# Patient Record
Sex: Male | Born: 1935 | Race: White | Hispanic: No | Marital: Married | Smoking: Never smoker
Health system: Southern US, Community
[De-identification: ages and names within clinical notes are randomized; demographics above are authoritative.]

## PROBLEM LIST (undated history)

## (undated) DIAGNOSIS — Z9079 Acquired absence of other genital organ(s): Secondary | ICD-10-CM

## (undated) DIAGNOSIS — N189 Chronic kidney disease, unspecified: Secondary | ICD-10-CM

## (undated) DIAGNOSIS — B029 Zoster without complications: Secondary | ICD-10-CM

## (undated) DIAGNOSIS — N400 Enlarged prostate without lower urinary tract symptoms: Secondary | ICD-10-CM

## (undated) DIAGNOSIS — I4891 Unspecified atrial fibrillation: Secondary | ICD-10-CM

## (undated) DIAGNOSIS — K219 Gastro-esophageal reflux disease without esophagitis: Secondary | ICD-10-CM

## (undated) DIAGNOSIS — E785 Hyperlipidemia, unspecified: Secondary | ICD-10-CM

## (undated) DIAGNOSIS — R413 Other amnesia: Secondary | ICD-10-CM

## (undated) DIAGNOSIS — K5790 Diverticulosis of intestine, part unspecified, without perforation or abscess without bleeding: Secondary | ICD-10-CM

## (undated) HISTORY — DX: Chronic kidney disease, unspecified: N18.9

## (undated) HISTORY — DX: Zoster without complications: B02.9

## (undated) HISTORY — PX: ORCHIECTOMY: SHX2116

## (undated) HISTORY — PX: HERNIA REPAIR: SHX51

## (undated) HISTORY — DX: Diverticulosis of intestine, part unspecified, without perforation or abscess without bleeding: K57.90

## (undated) HISTORY — DX: Gastro-esophageal reflux disease without esophagitis: K21.9

## (undated) HISTORY — DX: Unspecified atrial fibrillation: I48.91

## (undated) HISTORY — DX: Benign prostatic hyperplasia without lower urinary tract symptoms: N40.0

## (undated) HISTORY — DX: Other amnesia: R41.3

## (undated) HISTORY — DX: Hyperlipidemia, unspecified: E78.5

## (undated) HISTORY — DX: Acquired absence of other genital organ(s): Z90.79

---

## 2004-05-11 ENCOUNTER — Encounter: Admission: RE | Admit: 2004-05-11 | Discharge: 2004-05-11 | Payer: Self-pay | Admitting: Family Medicine

## 2004-07-05 ENCOUNTER — Encounter: Admission: RE | Admit: 2004-07-05 | Discharge: 2004-07-05 | Payer: Self-pay | Admitting: Family Medicine

## 2005-02-25 ENCOUNTER — Encounter: Admission: RE | Admit: 2005-02-25 | Discharge: 2005-02-25 | Payer: Self-pay | Admitting: Family Medicine

## 2005-03-12 ENCOUNTER — Emergency Department (HOSPITAL_COMMUNITY): Admission: EM | Admit: 2005-03-12 | Discharge: 2005-03-12 | Payer: Self-pay | Admitting: Emergency Medicine

## 2006-06-28 ENCOUNTER — Encounter: Admission: RE | Admit: 2006-06-28 | Discharge: 2006-06-28 | Payer: Self-pay | Admitting: Family Medicine

## 2008-06-09 ENCOUNTER — Encounter: Admission: RE | Admit: 2008-06-09 | Discharge: 2008-06-09 | Payer: Self-pay | Admitting: Family Medicine

## 2009-07-24 ENCOUNTER — Ambulatory Visit (HOSPITAL_BASED_OUTPATIENT_CLINIC_OR_DEPARTMENT_OTHER): Admission: RE | Admit: 2009-07-24 | Discharge: 2009-07-24 | Payer: Self-pay | Admitting: Urology

## 2009-09-25 ENCOUNTER — Ambulatory Visit (HOSPITAL_COMMUNITY): Admission: RE | Admit: 2009-09-25 | Discharge: 2009-09-25 | Payer: Self-pay | Admitting: Interventional Cardiology

## 2010-05-15 LAB — CBC
MCV: 90.8 fL (ref 78.0–100.0)
Platelets: 166 10*3/uL (ref 150–400)
RBC: 4.44 MIL/uL (ref 4.22–5.81)
WBC: 5 10*3/uL (ref 4.0–10.5)

## 2010-05-15 LAB — BASIC METABOLIC PANEL
Calcium: 8.5 mg/dL (ref 8.4–10.5)
Chloride: 109 mEq/L (ref 96–112)
Creatinine, Ser: 1.61 mg/dL — ABNORMAL HIGH (ref 0.4–1.5)
GFR calc Af Amer: 51 mL/min — ABNORMAL LOW (ref >60)

## 2010-05-17 LAB — POCT I-STAT 4, (NA,K, GLUC, HGB,HCT)
Glucose, Bld: 101 mg/dL — ABNORMAL HIGH (ref 70–99)
HCT: 41 % (ref 39.0–52.0)
Potassium: 4.5 mEq/L (ref 3.5–5.1)
Sodium: 137 mEq/L (ref 135–145)

## 2010-11-22 ENCOUNTER — Emergency Department (HOSPITAL_COMMUNITY)
Admission: EM | Admit: 2010-11-22 | Discharge: 2010-11-22 | Disposition: A | Discharge status: Home / Self Care | Payer: Medicare Other | Attending: Emergency Medicine | Admitting: Emergency Medicine

## 2010-11-22 ENCOUNTER — Emergency Department (HOSPITAL_COMMUNITY): Payer: Medicare Other

## 2010-11-22 DIAGNOSIS — R002 Palpitations: Secondary | ICD-10-CM | POA: Insufficient documentation

## 2010-11-22 DIAGNOSIS — Z7901 Long term (current) use of anticoagulants: Secondary | ICD-10-CM | POA: Insufficient documentation

## 2010-11-22 DIAGNOSIS — I2510 Atherosclerotic heart disease of native coronary artery without angina pectoris: Secondary | ICD-10-CM | POA: Insufficient documentation

## 2010-11-22 DIAGNOSIS — I4891 Unspecified atrial fibrillation: Secondary | ICD-10-CM | POA: Insufficient documentation

## 2010-11-22 DIAGNOSIS — Z79899 Other long term (current) drug therapy: Secondary | ICD-10-CM | POA: Insufficient documentation

## 2010-11-22 DIAGNOSIS — I10 Essential (primary) hypertension: Secondary | ICD-10-CM | POA: Insufficient documentation

## 2010-11-22 LAB — DIFFERENTIAL
Basophils Absolute: 0 10*3/uL (ref 0.0–0.1)
Basophils Relative: 0 % (ref 0–1)
Eosinophils Absolute: 0.1 10*3/uL (ref 0.0–0.7)
Eosinophils Relative: 1 % (ref 0–5)
Monocytes Absolute: 0.7 10*3/uL (ref 0.1–1.0)

## 2010-11-22 LAB — COMPREHENSIVE METABOLIC PANEL
AST: 17 U/L (ref 0–37)
Albumin: 4.1 g/dL (ref 3.5–5.2)
BUN: 26 mg/dL — ABNORMAL HIGH (ref 6–23)
CO2: 24 mEq/L (ref 19–32)
Calcium: 9.2 mg/dL (ref 8.4–10.5)
Creatinine, Ser: 1.76 mg/dL — ABNORMAL HIGH (ref 0.50–1.35)
GFR calc non Af Amer: 38 mL/min — ABNORMAL LOW (ref >60)

## 2010-11-22 LAB — CBC
MCHC: 33.9 g/dL (ref 30.0–36.0)
RDW: 14 % (ref 11.5–15.5)

## 2010-11-22 LAB — PROTIME-INR
INR: 2.18 — ABNORMAL HIGH (ref 0.00–1.49)
Prothrombin Time: 24.6 seconds — ABNORMAL HIGH (ref 11.6–15.2)

## 2010-11-22 LAB — CK TOTAL AND CKMB (NOT AT ARMC)
CK, MB: 3.1 ng/mL (ref 0.3–4.0)
Relative Index: 2.6 — ABNORMAL HIGH (ref 0.0–2.5)
Total CK: 119 U/L (ref 7–232)

## 2012-03-09 ENCOUNTER — Other Ambulatory Visit: Payer: Self-pay | Admitting: Neurology

## 2012-03-09 DIAGNOSIS — R413 Other amnesia: Secondary | ICD-10-CM

## 2012-03-15 ENCOUNTER — Ambulatory Visit
Admission: RE | Admit: 2012-03-15 | Discharge: 2012-03-15 | Disposition: A | Discharge status: Home / Self Care | Payer: No Typology Code available for payment source | Source: Ambulatory Visit | Attending: Neurology | Admitting: Neurology

## 2012-03-15 DIAGNOSIS — R413 Other amnesia: Secondary | ICD-10-CM

## 2012-03-24 ENCOUNTER — Other Ambulatory Visit (HOSPITAL_COMMUNITY): Payer: Self-pay | Admitting: Neurology

## 2012-03-24 DIAGNOSIS — R413 Other amnesia: Secondary | ICD-10-CM

## 2012-04-12 ENCOUNTER — Encounter (HOSPITAL_COMMUNITY): Payer: No Typology Code available for payment source

## 2012-04-19 ENCOUNTER — Ambulatory Visit (HOSPITAL_COMMUNITY)
Admission: RE | Admit: 2012-04-19 | Discharge: 2012-04-19 | Disposition: A | Discharge status: Home / Self Care | Payer: No Typology Code available for payment source | Source: Ambulatory Visit | Attending: Neurology | Admitting: Neurology

## 2012-04-19 ENCOUNTER — Encounter (HOSPITAL_COMMUNITY): Payer: Self-pay

## 2012-04-19 DIAGNOSIS — Z006 Encounter for examination for normal comparison and control in clinical research program: Secondary | ICD-10-CM | POA: Insufficient documentation

## 2012-04-19 DIAGNOSIS — R413 Other amnesia: Secondary | ICD-10-CM | POA: Insufficient documentation

## 2012-05-17 ENCOUNTER — Ambulatory Visit (INDEPENDENT_AMBULATORY_CARE_PROVIDER_SITE_OTHER): Payer: Self-pay | Admitting: *Deleted

## 2012-05-17 NOTE — Research (Signed)
Patient was seen today for the Visit 2 of the EXPEDITION 3 trial. Retest labs were drawn on April 19, 2012 since patient was out of screening window. Dr. Pearlean Brownie performed and neuro and physical exam. Dr. Pearlean Brownie review all of the data collected prior to randomization and determined that patient was still eligible to proceed with the Baseline/Randomization visit. Multiple questionnaires were completed including MMSE, ADAS Cog, ADCS, CDR, NPI, FAQ, RUD, EQ-5D, Quality of Life AD, and CSSRS. Vitals signs were obtain per trial requirements. ECG's were performed in triplicate and reviewed by Dr. Pearlean Brownie. Patient stated that there were no AE's, SAE's or  changes in medication since last office visit. Dr. Pearlean Brownie stated that patient meets the inclusion/exclusion criteria to be randomized. At 13:58, a 22g cath was started in right [facility] X 1 attempt. Visit 2 labs, Biomarkers, and APOE were collect from IV site at 14:00.  Patient was randomized to study drug # [id number]. I mixed the study drug with 50cc of NS at 14:06 and the study drug infusion was started at 14:12. Study infusion was complete at 14:42 and flushed with 20cc of NS. Patient tolerated infusion well. Post infusion Plasma [id number] lab was drawn from left AC at 15:05. Patient was monitored in clinic til 15:50 and released with caregiver in stable condition. Instructions and follow-up calender was given to caregiver prior to release from clinic.

## 2012-06-08 ENCOUNTER — Ambulatory Visit (INDEPENDENT_AMBULATORY_CARE_PROVIDER_SITE_OTHER): Payer: Self-pay | Admitting: *Deleted

## 2012-06-08 DIAGNOSIS — F0280 Dementia in other diseases classified elsewhere without behavioral disturbance: Secondary | ICD-10-CM

## 2012-06-08 DIAGNOSIS — G309 Alzheimer's disease, unspecified: Secondary | ICD-10-CM | POA: Insufficient documentation

## 2012-06-08 NOTE — Progress Notes (Signed)
Patient was seen today for the visit 3 in the EXPEDITION 3 trial. No reported adverse events or SAE's. Labs were drawn prior to infusion. Patient was given study drug 2269 via infusion and released from the research department in stable condition.

## 2012-07-12 ENCOUNTER — Encounter (INDEPENDENT_AMBULATORY_CARE_PROVIDER_SITE_OTHER): Payer: Self-pay

## 2012-07-12 DIAGNOSIS — Z0289 Encounter for other administrative examinations: Secondary | ICD-10-CM

## 2012-07-29 ENCOUNTER — Other Ambulatory Visit: Payer: Self-pay | Admitting: Neurology

## 2012-08-06 ENCOUNTER — Ambulatory Visit (INDEPENDENT_AMBULATORY_CARE_PROVIDER_SITE_OTHER): Payer: Self-pay

## 2012-08-06 DIAGNOSIS — Z0289 Encounter for other administrative examinations: Secondary | ICD-10-CM

## 2012-09-06 ENCOUNTER — Ambulatory Visit (INDEPENDENT_AMBULATORY_CARE_PROVIDER_SITE_OTHER): Payer: Self-pay | Admitting: *Deleted

## 2012-09-06 DIAGNOSIS — Z0289 Encounter for other administrative examinations: Secondary | ICD-10-CM

## 2012-10-04 ENCOUNTER — Ambulatory Visit (INDEPENDENT_AMBULATORY_CARE_PROVIDER_SITE_OTHER): Payer: Self-pay

## 2012-10-04 DIAGNOSIS — Z0289 Encounter for other administrative examinations: Secondary | ICD-10-CM

## 2012-10-05 NOTE — Progress Notes (Signed)
NA

## 2012-10-26 ENCOUNTER — Encounter: Payer: Self-pay | Admitting: Neurology

## 2012-10-26 DIAGNOSIS — I4891 Unspecified atrial fibrillation: Secondary | ICD-10-CM | POA: Insufficient documentation

## 2012-10-26 DIAGNOSIS — N400 Enlarged prostate without lower urinary tract symptoms: Secondary | ICD-10-CM | POA: Insufficient documentation

## 2012-10-26 DIAGNOSIS — N189 Chronic kidney disease, unspecified: Secondary | ICD-10-CM | POA: Insufficient documentation

## 2012-10-26 DIAGNOSIS — E785 Hyperlipidemia, unspecified: Secondary | ICD-10-CM | POA: Insufficient documentation

## 2012-10-26 DIAGNOSIS — K219 Gastro-esophageal reflux disease without esophagitis: Secondary | ICD-10-CM | POA: Insufficient documentation

## 2012-10-26 DIAGNOSIS — Z9079 Acquired absence of other genital organ(s): Secondary | ICD-10-CM | POA: Insufficient documentation

## 2012-10-26 DIAGNOSIS — K5790 Diverticulosis of intestine, part unspecified, without perforation or abscess without bleeding: Secondary | ICD-10-CM | POA: Insufficient documentation

## 2012-10-26 DIAGNOSIS — N529 Male erectile dysfunction, unspecified: Secondary | ICD-10-CM | POA: Insufficient documentation

## 2012-10-26 DIAGNOSIS — B029 Zoster without complications: Secondary | ICD-10-CM | POA: Insufficient documentation

## 2012-11-01 ENCOUNTER — Encounter (INDEPENDENT_AMBULATORY_CARE_PROVIDER_SITE_OTHER): Payer: Self-pay

## 2012-11-01 DIAGNOSIS — Z0289 Encounter for other administrative examinations: Secondary | ICD-10-CM

## 2012-11-22 ENCOUNTER — Encounter (INDEPENDENT_AMBULATORY_CARE_PROVIDER_SITE_OTHER): Payer: Self-pay

## 2012-11-22 DIAGNOSIS — Z0289 Encounter for other administrative examinations: Secondary | ICD-10-CM

## 2012-11-28 ENCOUNTER — Ambulatory Visit (INDEPENDENT_AMBULATORY_CARE_PROVIDER_SITE_OTHER): Payer: Medicare Other | Admitting: Pharmacist

## 2012-11-28 DIAGNOSIS — I4891 Unspecified atrial fibrillation: Secondary | ICD-10-CM

## 2012-11-28 LAB — POCT INR: INR: 2.5

## 2012-12-17 ENCOUNTER — Encounter: Payer: Self-pay | Admitting: Interventional Cardiology

## 2012-12-17 ENCOUNTER — Ambulatory Visit (INDEPENDENT_AMBULATORY_CARE_PROVIDER_SITE_OTHER): Payer: Medicare Other | Admitting: Interventional Cardiology

## 2012-12-17 VITALS — BP 110/70 | HR 65 | Ht 72.0 in | Wt 163.0 lb

## 2012-12-17 DIAGNOSIS — I4891 Unspecified atrial fibrillation: Secondary | ICD-10-CM

## 2012-12-17 DIAGNOSIS — I10 Essential (primary) hypertension: Secondary | ICD-10-CM | POA: Insufficient documentation

## 2012-12-17 DIAGNOSIS — E782 Mixed hyperlipidemia: Secondary | ICD-10-CM | POA: Insufficient documentation

## 2012-12-17 NOTE — Progress Notes (Signed)
Patient ID: Steven Thompson, male   DOB: Jul 25, 1932, 79 y.o.   MRN: [id number]    [address] [place], [place]  [address] Phone: [phone] Fax:  [phone]  Date:  12/17/2012   ID:  Steven Thompson, DOB May 30, 1932, MRN [id number]  PCP:  No primary provider on file.      History of Present Illness: Steven Thompson is a 79 y.o. male who has had difficult to control HTN. He continues to exercise. His BP has beenwell controlled.He has had readings in the 110-120s at times in the past few months. Atrial Fibrillation F/U:  No sx of AFib. No bleeding problems. Denies : Chest pain.  Dizziness.  Leg edema.  Palpitations.  Shortness of breath.  Syncope.  Exercising several times a week.     Wt Readings from Last 3 Encounters:  12/17/12 163 lb (73.936 kg)  03/07/12 171 lb (77.565 kg)     Past Medical History  Diagnosis Date  . Memory loss   . Atrial fibrillation   . Diverticulosis   . Gastroesophageal reflux   . Hyperlipidemia   . Benign prostatic hypertrophy   . Herpes zoster   . Chronic kidney disease   . History of orchiectomy, unilateral     Current Outpatient Prescriptions  Medication Sig Dispense Refill  . amLODipine (NORVASC) 5 MG tablet Take 5 mg by mouth daily.      Marland Kitchen diltiazem (CARDIZEM) 120 MG tablet Take 120 mg by mouth 4 (four) times daily.      . finasteride (PROSCAR) 5 MG tablet Take 5 mg by mouth daily.      . fish oil-omega-3 fatty acids 1000 MG capsule Take 2 g by mouth daily.      . flecainide (TAMBOCOR) 100 MG tablet Take 100 mg by mouth 2 (two) times daily.      . Methylfol-Algae-B12-Acetylcyst (CEREFOLIN NAC) 6-90.314-2-600 MG TABS TAKE 1 TABLET BY MOUTH EVERY DAY  90 tablet  1  . omeprazole (PRILOSEC) 20 MG capsule Take 20 mg by mouth daily.      Marland Kitchen PARoxetine (PAXIL) 20 MG tablet Take 20 mg by mouth every morning.      . rosuvastatin (CRESTOR) 10 MG tablet Take 10 mg by mouth daily.      Marland Kitchen warfarin (COUMADIN) 5 MG tablet Take 5  mg by mouth daily.       No current facility-administered medications for this visit.    Allergies:   No Known Allergies  Social History:  The patient  reports that he has never smoked. He does not have any smokeless tobacco history on file. He reports that he does not drink alcohol or use illicit drugs.   Family History:  The patient's family history includes Heart attack in his father; Stroke in his mother.   ROS:  Please see the history of present illness.  No nausea, vomiting.  No fevers, chills.  No focal weakness.  No dysuria.    All other systems reviewed and negative.   PHYSICAL EXAM: VS:  BP 110/70  Pulse 65  Ht 6' (1.829 m)  Wt 163 lb (73.936 kg)  BMI 22.1 kg/m2 Well nourished, well developed, in no acute distress HEENT: normal Neck: no JVD, no carotid bruits Cardiac:  normal S1, S2; RRR; 2/6 murmur, systolic Lungs:  clear to auscultation bilaterally, no wheezing, rhonchi or rales Abd: soft, nontender, no hepatomegaly Ext: no edema Skin: warm and dry Neuro:   no focal abnormalities noted  EKG:  NSR, prolonged PR, IVCD    ASSESSMENT AND PLAN:  Atrial fibrillation  Continue Flecainide Acetate Tablet, 100 MG, 1 tablet, twice a day No signs of AFib. COumadin for stroke prevention.  Coumadin typically in range. 2. Hypertension, essential  Continue Diltiazem HCl Coated Beads Capsule Extended Release 24 Hour, 120 MG, 1 tablet, once a day Increase Amlodipine Besylate Tablet, 10 MG, 1 tablet, Orally, Once a day for BP, 90 days, 90, Refills 3 BP controlled at home on combination of calcium channel blockers. Target systolic < 140.  Try for 150 minutes /week of exercise. 3. Mixed hyperlipidemia  Continue Crestor Tablet, 10 MG, 1 tablet, Orally, once a day for cholesterol Lipids reviewed and well-controlled.  LDL 97 in 10/14.  HDL 88.     Signed, Fredric Mare, MD, [facility] 12/17/2012 12:27 PM

## 2012-12-17 NOTE — Patient Instructions (Signed)
Your physician wants you to follow-up in: 1 year with Dr. Varanasi. You will receive a reminder letter in the mail two months in advance. If you don't receive a letter, please call our office to schedule the follow-up appointment.  Your physician recommends that you continue on your current medications as directed. Please refer to the Current Medication list given to you today.  

## 2012-12-27 ENCOUNTER — Encounter (INDEPENDENT_AMBULATORY_CARE_PROVIDER_SITE_OTHER): Payer: Self-pay

## 2012-12-27 DIAGNOSIS — Z0289 Encounter for other administrative examinations: Secondary | ICD-10-CM

## 2013-01-09 ENCOUNTER — Ambulatory Visit (INDEPENDENT_AMBULATORY_CARE_PROVIDER_SITE_OTHER): Payer: Medicare Other | Admitting: Pharmacist

## 2013-01-09 ENCOUNTER — Encounter (INDEPENDENT_AMBULATORY_CARE_PROVIDER_SITE_OTHER): Payer: Self-pay

## 2013-01-09 DIAGNOSIS — I4891 Unspecified atrial fibrillation: Secondary | ICD-10-CM

## 2013-01-09 LAB — POCT INR: INR: 2.9

## 2013-01-21 ENCOUNTER — Other Ambulatory Visit: Payer: Self-pay | Admitting: Neurology

## 2013-01-31 ENCOUNTER — Telehealth: Payer: Self-pay | Admitting: *Deleted

## 2013-01-31 ENCOUNTER — Encounter (INDEPENDENT_AMBULATORY_CARE_PROVIDER_SITE_OTHER): Payer: Self-pay

## 2013-01-31 DIAGNOSIS — Z0289 Encounter for other administrative examinations: Secondary | ICD-10-CM

## 2013-01-31 NOTE — Telephone Encounter (Signed)
Yes if he can afford it

## 2013-01-31 NOTE — Telephone Encounter (Signed)
Pt in for research appt with W. Tilda Franco.  Pt asking about continuing the Cerefolin NAC.   Please advise.   Thanks.

## 2013-02-04 ENCOUNTER — Other Ambulatory Visit: Payer: Self-pay | Admitting: Neurology

## 2013-02-04 NOTE — Telephone Encounter (Signed)
Insurance does not cover Cerefolin, as it is considered a dietary supplement.  I called the patient and explained this to him.    I advised him we could send the Rx to [facility], which is a mail order many people use because ins does not cover this med.  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  If this Rx costs too much, they will call back and we will send Rx to [facility].

## 2013-02-04 NOTE — Telephone Encounter (Signed)
I called pt and relayed that Dr. Pearlean Brownie did want him to continue this if could afford.   Pt stated he needed PA for this.  Will send to [facility] in [place].

## 2013-02-05 ENCOUNTER — Other Ambulatory Visit: Payer: Self-pay | Admitting: Interventional Cardiology

## 2013-02-06 ENCOUNTER — Other Ambulatory Visit: Payer: Self-pay | Admitting: Interventional Cardiology

## 2013-02-08 ENCOUNTER — Other Ambulatory Visit: Payer: Self-pay | Admitting: *Deleted

## 2013-02-22 ENCOUNTER — Ambulatory Visit (INDEPENDENT_AMBULATORY_CARE_PROVIDER_SITE_OTHER): Payer: Medicare Other | Admitting: Pharmacist

## 2013-02-22 DIAGNOSIS — I4891 Unspecified atrial fibrillation: Secondary | ICD-10-CM

## 2013-02-22 LAB — POCT INR: INR: 2.1

## 2013-02-25 ENCOUNTER — Encounter (INDEPENDENT_AMBULATORY_CARE_PROVIDER_SITE_OTHER): Payer: Self-pay

## 2013-02-25 DIAGNOSIS — Z0289 Encounter for other administrative examinations: Secondary | ICD-10-CM

## 2013-02-27 ENCOUNTER — Other Ambulatory Visit: Payer: Self-pay | Admitting: Interventional Cardiology

## 2013-03-19 ENCOUNTER — Encounter (INDEPENDENT_AMBULATORY_CARE_PROVIDER_SITE_OTHER): Payer: Self-pay

## 2013-03-19 DIAGNOSIS — Z0289 Encounter for other administrative examinations: Secondary | ICD-10-CM

## 2013-03-25 ENCOUNTER — Encounter: Payer: Self-pay | Admitting: Cardiology

## 2013-03-25 ENCOUNTER — Ambulatory Visit (HOSPITAL_COMMUNITY): Payer: Medicare Other | Attending: Cardiology | Admitting: Cardiology

## 2013-03-25 ENCOUNTER — Other Ambulatory Visit (HOSPITAL_COMMUNITY): Payer: Self-pay | Admitting: Interventional Cardiology

## 2013-03-25 DIAGNOSIS — I080 Rheumatic disorders of both mitral and aortic valves: Secondary | ICD-10-CM | POA: Insufficient documentation

## 2013-03-25 DIAGNOSIS — I4891 Unspecified atrial fibrillation: Secondary | ICD-10-CM

## 2013-03-25 DIAGNOSIS — I10 Essential (primary) hypertension: Secondary | ICD-10-CM | POA: Insufficient documentation

## 2013-03-25 DIAGNOSIS — I379 Nonrheumatic pulmonary valve disorder, unspecified: Secondary | ICD-10-CM | POA: Insufficient documentation

## 2013-03-25 DIAGNOSIS — I359 Nonrheumatic aortic valve disorder, unspecified: Secondary | ICD-10-CM

## 2013-03-25 DIAGNOSIS — E785 Hyperlipidemia, unspecified: Secondary | ICD-10-CM | POA: Insufficient documentation

## 2013-03-25 DIAGNOSIS — I079 Rheumatic tricuspid valve disease, unspecified: Secondary | ICD-10-CM | POA: Insufficient documentation

## 2013-03-25 NOTE — Progress Notes (Signed)
Echo performed. 

## 2013-04-05 ENCOUNTER — Ambulatory Visit (INDEPENDENT_AMBULATORY_CARE_PROVIDER_SITE_OTHER): Payer: Medicare Other | Admitting: *Deleted

## 2013-04-05 DIAGNOSIS — I4891 Unspecified atrial fibrillation: Secondary | ICD-10-CM

## 2013-04-05 DIAGNOSIS — Z5181 Encounter for therapeutic drug level monitoring: Secondary | ICD-10-CM

## 2013-04-05 LAB — POCT INR: INR: 3.7

## 2013-04-17 ENCOUNTER — Ambulatory Visit (INDEPENDENT_AMBULATORY_CARE_PROVIDER_SITE_OTHER): Payer: Self-pay | Admitting: Neurology

## 2013-04-17 VITALS — BP 118/62 | HR 65 | Temp 98.4°F | Resp 18 | Wt 157.4 lb

## 2013-04-17 DIAGNOSIS — Z0289 Encounter for other administrative examinations: Secondary | ICD-10-CM

## 2013-04-17 DIAGNOSIS — F0280 Dementia in other diseases classified elsewhere without behavioral disturbance: Secondary | ICD-10-CM

## 2013-04-17 DIAGNOSIS — G309 Alzheimer's disease, unspecified: Principal | ICD-10-CM

## 2013-04-17 NOTE — Progress Notes (Signed)
Patient was seen today for visit 14 in the Expedition 3 trial. During the visit, patient's caregiver reported that patient has been a significant weight loss since last year in January. In January, patient weighted was 170.0lbs and today patient is weighing 157.4lbs. Caregiver is also reporting an increase in fatigue. Patient stated that he did not wish to receive an infusion today until more information was obtain about his weight loss and fatigue. Dr. Pearlean BrownieSethi advised patient to follow up with his PCP for the concerns. Site staff contacted Dr. Randel BooksEhinger's office to report patient's complaints.

## 2013-04-19 ENCOUNTER — Ambulatory Visit (INDEPENDENT_AMBULATORY_CARE_PROVIDER_SITE_OTHER): Payer: Medicare Other | Admitting: *Deleted

## 2013-04-19 DIAGNOSIS — Z5181 Encounter for therapeutic drug level monitoring: Secondary | ICD-10-CM

## 2013-04-19 DIAGNOSIS — I4891 Unspecified atrial fibrillation: Secondary | ICD-10-CM

## 2013-04-19 LAB — POCT INR: INR: 3

## 2013-05-02 ENCOUNTER — Other Ambulatory Visit: Payer: Self-pay | Admitting: Family Medicine

## 2013-05-02 DIAGNOSIS — R5383 Other fatigue: Secondary | ICD-10-CM

## 2013-05-02 DIAGNOSIS — R700 Elevated erythrocyte sedimentation rate: Secondary | ICD-10-CM

## 2013-05-02 DIAGNOSIS — R634 Abnormal weight loss: Secondary | ICD-10-CM

## 2013-05-08 ENCOUNTER — Ambulatory Visit
Admission: RE | Admit: 2013-05-08 | Discharge: 2013-05-08 | Disposition: A | Discharge status: Home / Self Care | Payer: Medicare Other | Source: Ambulatory Visit | Attending: Family Medicine | Admitting: Family Medicine

## 2013-05-08 DIAGNOSIS — R5383 Other fatigue: Secondary | ICD-10-CM

## 2013-05-08 DIAGNOSIS — R634 Abnormal weight loss: Secondary | ICD-10-CM

## 2013-05-08 DIAGNOSIS — R700 Elevated erythrocyte sedimentation rate: Secondary | ICD-10-CM

## 2013-05-16 ENCOUNTER — Encounter (INDEPENDENT_AMBULATORY_CARE_PROVIDER_SITE_OTHER): Payer: Self-pay

## 2013-05-16 DIAGNOSIS — Z0289 Encounter for other administrative examinations: Secondary | ICD-10-CM

## 2013-05-17 ENCOUNTER — Ambulatory Visit (INDEPENDENT_AMBULATORY_CARE_PROVIDER_SITE_OTHER): Payer: Medicare Other | Admitting: *Deleted

## 2013-05-17 DIAGNOSIS — I4891 Unspecified atrial fibrillation: Secondary | ICD-10-CM

## 2013-05-17 DIAGNOSIS — Z5181 Encounter for therapeutic drug level monitoring: Secondary | ICD-10-CM

## 2013-05-17 LAB — POCT INR: INR: 3.5

## 2013-05-23 ENCOUNTER — Other Ambulatory Visit: Payer: Self-pay | Admitting: Family Medicine

## 2013-05-23 DIAGNOSIS — R634 Abnormal weight loss: Secondary | ICD-10-CM

## 2013-05-23 DIAGNOSIS — R700 Elevated erythrocyte sedimentation rate: Secondary | ICD-10-CM

## 2013-05-28 ENCOUNTER — Ambulatory Visit
Admission: RE | Admit: 2013-05-28 | Discharge: 2013-05-28 | Disposition: A | Discharge status: Home / Self Care | Payer: Medicare Other | Source: Ambulatory Visit | Attending: Family Medicine | Admitting: Family Medicine

## 2013-05-28 DIAGNOSIS — R634 Abnormal weight loss: Secondary | ICD-10-CM

## 2013-05-28 DIAGNOSIS — R700 Elevated erythrocyte sedimentation rate: Secondary | ICD-10-CM

## 2013-05-31 ENCOUNTER — Ambulatory Visit (INDEPENDENT_AMBULATORY_CARE_PROVIDER_SITE_OTHER): Payer: Medicare Other | Admitting: *Deleted

## 2013-05-31 DIAGNOSIS — I4891 Unspecified atrial fibrillation: Secondary | ICD-10-CM

## 2013-05-31 DIAGNOSIS — Z5181 Encounter for therapeutic drug level monitoring: Secondary | ICD-10-CM

## 2013-05-31 LAB — POCT INR: INR: 3.9

## 2013-06-01 ENCOUNTER — Encounter (HOSPITAL_COMMUNITY): Payer: Self-pay | Admitting: Emergency Medicine

## 2013-06-01 ENCOUNTER — Inpatient Hospital Stay (HOSPITAL_COMMUNITY)
Admission: EM | Admit: 2013-06-01 | Discharge: 2013-06-28 | DRG: 193 | Disposition: E | Payer: Medicare Other | Attending: Internal Medicine | Admitting: Internal Medicine

## 2013-06-01 ENCOUNTER — Emergency Department (HOSPITAL_COMMUNITY): Payer: Medicare Other

## 2013-06-01 ENCOUNTER — Inpatient Hospital Stay (HOSPITAL_COMMUNITY): Payer: Medicare Other

## 2013-06-01 DIAGNOSIS — J9601 Acute respiratory failure with hypoxia: Secondary | ICD-10-CM

## 2013-06-01 DIAGNOSIS — N179 Acute kidney failure, unspecified: Secondary | ICD-10-CM

## 2013-06-01 DIAGNOSIS — J80 Acute respiratory distress syndrome: Secondary | ICD-10-CM

## 2013-06-01 DIAGNOSIS — K219 Gastro-esophageal reflux disease without esophagitis: Secondary | ICD-10-CM | POA: Diagnosis present

## 2013-06-01 DIAGNOSIS — I498 Other specified cardiac arrhythmias: Secondary | ICD-10-CM | POA: Diagnosis present

## 2013-06-01 DIAGNOSIS — Z9079 Acquired absence of other genital organ(s): Secondary | ICD-10-CM

## 2013-06-01 DIAGNOSIS — Z515 Encounter for palliative care: Secondary | ICD-10-CM

## 2013-06-01 DIAGNOSIS — N189 Chronic kidney disease, unspecified: Secondary | ICD-10-CM

## 2013-06-01 DIAGNOSIS — F0280 Dementia in other diseases classified elsewhere without behavioral disturbance: Secondary | ICD-10-CM

## 2013-06-01 DIAGNOSIS — F0390 Unspecified dementia without behavioral disturbance: Secondary | ICD-10-CM | POA: Diagnosis present

## 2013-06-01 DIAGNOSIS — R011 Cardiac murmur, unspecified: Secondary | ICD-10-CM | POA: Diagnosis present

## 2013-06-01 DIAGNOSIS — Z8249 Family history of ischemic heart disease and other diseases of the circulatory system: Secondary | ICD-10-CM

## 2013-06-01 DIAGNOSIS — I10 Essential (primary) hypertension: Secondary | ICD-10-CM

## 2013-06-01 DIAGNOSIS — J9600 Acute respiratory failure, unspecified whether with hypoxia or hypercapnia: Secondary | ICD-10-CM | POA: Diagnosis present

## 2013-06-01 DIAGNOSIS — J189 Pneumonia, unspecified organism: Principal | ICD-10-CM

## 2013-06-01 DIAGNOSIS — E43 Unspecified severe protein-calorie malnutrition: Secondary | ICD-10-CM | POA: Diagnosis present

## 2013-06-01 DIAGNOSIS — G309 Alzheimer's disease, unspecified: Secondary | ICD-10-CM | POA: Diagnosis present

## 2013-06-01 DIAGNOSIS — K5730 Diverticulosis of large intestine without perforation or abscess without bleeding: Secondary | ICD-10-CM | POA: Diagnosis present

## 2013-06-01 DIAGNOSIS — Z823 Family history of stroke: Secondary | ICD-10-CM

## 2013-06-01 DIAGNOSIS — E871 Hypo-osmolality and hyponatremia: Secondary | ICD-10-CM | POA: Diagnosis present

## 2013-06-01 DIAGNOSIS — I4891 Unspecified atrial fibrillation: Secondary | ICD-10-CM

## 2013-06-01 DIAGNOSIS — Z5181 Encounter for therapeutic drug level monitoring: Secondary | ICD-10-CM

## 2013-06-01 DIAGNOSIS — E785 Hyperlipidemia, unspecified: Secondary | ICD-10-CM | POA: Diagnosis present

## 2013-06-01 DIAGNOSIS — Z66 Do not resuscitate: Secondary | ICD-10-CM | POA: Diagnosis present

## 2013-06-01 DIAGNOSIS — J9589 Other postprocedural complications and disorders of respiratory system, not elsewhere classified: Secondary | ICD-10-CM

## 2013-06-01 DIAGNOSIS — N400 Enlarged prostate without lower urinary tract symptoms: Secondary | ICD-10-CM

## 2013-06-01 LAB — COMPREHENSIVE METABOLIC PANEL
ALK PHOS: 82 U/L (ref 39–117)
ALT: 9 U/L (ref 0–53)
AST: 11 U/L (ref 0–37)
Albumin: 2.6 g/dL — ABNORMAL LOW (ref 3.5–5.2)
BUN: 26 mg/dL — ABNORMAL HIGH (ref 6–23)
CHLORIDE: 103 meq/L (ref 96–112)
CO2: 21 mEq/L (ref 19–32)
Calcium: 8.4 mg/dL (ref 8.4–10.5)
Creatinine, Ser: 1.63 mg/dL — ABNORMAL HIGH (ref 0.50–1.35)
GFR, EST AFRICAN AMERICAN: 45 mL/min — AB (ref >90)
GFR, EST NON AFRICAN AMERICAN: 38 mL/min — AB (ref >90)
GLUCOSE: 138 mg/dL — AB (ref 70–99)
POTASSIUM: 4.2 meq/L (ref 3.7–5.3)
SODIUM: 138 meq/L (ref 137–147)
Total Bilirubin: 0.3 mg/dL (ref 0.3–1.2)
Total Protein: 6.5 g/dL (ref 6.0–8.3)

## 2013-06-01 LAB — I-STAT CHEM 8, ED
BUN: 23 mg/dL (ref 6–23)
CREATININE: 1.8 mg/dL — AB (ref 0.50–1.35)
Calcium, Ion: 1.16 mmol/L (ref 1.13–1.30)
Chloride: 103 mEq/L (ref 96–112)
Glucose, Bld: 137 mg/dL — ABNORMAL HIGH (ref 70–99)
HCT: 27 % — ABNORMAL LOW (ref 39.0–52.0)
Hemoglobin: 9.2 g/dL — ABNORMAL LOW (ref 13.0–17.0)
POTASSIUM: 3.9 meq/L (ref 3.7–5.3)
Sodium: 136 mEq/L — ABNORMAL LOW (ref 137–147)
TCO2: 21 mmol/L (ref 0–100)

## 2013-06-01 LAB — LACTIC ACID, PLASMA: Lactic Acid, Venous: 1.9 mmol/L (ref 0.5–2.2)

## 2013-06-01 LAB — CBC
HEMATOCRIT: 28.8 % — AB (ref 39.0–52.0)
Hemoglobin: 9.7 g/dL — ABNORMAL LOW (ref 13.0–17.0)
MCH: 28 pg (ref 26.0–34.0)
MCHC: 33.7 g/dL (ref 30.0–36.0)
MCV: 83.2 fL (ref 78.0–100.0)
Platelets: 255 10*3/uL (ref 150–400)
RBC: 3.46 MIL/uL — ABNORMAL LOW (ref 4.22–5.81)
RDW: 14.9 % (ref 11.5–15.5)
WBC: 10 10*3/uL (ref 4.0–10.5)

## 2013-06-01 LAB — I-STAT ARTERIAL BLOOD GAS, ED
ACID-BASE DEFICIT: 3 mmol/L — AB (ref 0.0–2.0)
Bicarbonate: 20.2 mEq/L (ref 20.0–24.0)
O2 SAT: 99 %
Patient temperature: 98.6
TCO2: 21 mmol/L (ref 0–100)
pCO2 arterial: 28.5 mmHg — ABNORMAL LOW (ref 35.0–45.0)
pH, Arterial: 7.459 — ABNORMAL HIGH (ref 7.350–7.450)
pO2, Arterial: 109 mmHg — ABNORMAL HIGH (ref 80.0–100.0)

## 2013-06-01 LAB — PROTIME-INR
INR: 3.67 — ABNORMAL HIGH (ref 0.00–1.49)
Prothrombin Time: 35.1 seconds — ABNORMAL HIGH (ref 11.6–15.2)

## 2013-06-01 LAB — INFLUENZA PANEL BY PCR (TYPE A & B)
H1N1FLUPCR: NOT DETECTED
Influenza A By PCR: NEGATIVE
Influenza B By PCR: NEGATIVE

## 2013-06-01 LAB — MRSA PCR SCREENING: MRSA BY PCR: NEGATIVE

## 2013-06-01 LAB — STREP PNEUMONIAE URINARY ANTIGEN: Strep Pneumo Urinary Antigen: NEGATIVE

## 2013-06-01 LAB — GLUCOSE, CAPILLARY: GLUCOSE-CAPILLARY: 140 mg/dL — AB (ref 70–99)

## 2013-06-01 LAB — TROPONIN I: Troponin I: <0.3 ng/mL (ref <0.30)

## 2013-06-01 LAB — I-STAT CG4 LACTIC ACID, ED: Lactic Acid, Venous: 0.6 mmol/L (ref 0.5–2.2)

## 2013-06-01 MED ORDER — OSELTAMIVIR PHOSPHATE 30 MG PO CAPS
30.0000 mg | ORAL_CAPSULE | Freq: Two times a day (BID) | ORAL | Status: DC
  Administered 2013-06-01 (×2): 30 mg via ORAL
  Filled 2013-06-01 (×4): qty 1

## 2013-06-01 MED ORDER — SODIUM CHLORIDE 0.9 % IV SOLN: INTRAVENOUS | Status: DC | PRN

## 2013-06-01 MED ORDER — SODIUM CHLORIDE 0.9 % IV SOLN
INTRAVENOUS | Status: DC
  Administered 2013-06-01: 75 mL/h via INTRAVENOUS

## 2013-06-01 MED ORDER — MORPHINE SULFATE 2 MG/ML IJ SOLN
1.0000 mg | INTRAMUSCULAR | Status: DC | PRN
  Administered 2013-06-01 (×2): 1 mg via INTRAVENOUS
  Filled 2013-06-01 (×2): qty 1

## 2013-06-01 MED ORDER — AZITHROMYCIN 500 MG IV SOLR
500.0000 mg | INTRAVENOUS | Status: DC
  Administered 2013-06-01 – 2013-06-03 (×3): 500 mg via INTRAVENOUS
  Filled 2013-06-01 (×4): qty 500

## 2013-06-01 MED ORDER — CEFTRIAXONE SODIUM 1 G IJ SOLR
1.0000 g | INTRAMUSCULAR | Status: DC
  Administered 2013-06-01 – 2013-06-03 (×3): 1 g via INTRAVENOUS
  Filled 2013-06-01 (×4): qty 10

## 2013-06-01 MED ORDER — ALBUTEROL SULFATE (2.5 MG/3ML) 0.083% IN NEBU
2.5000 mg | INHALATION_SOLUTION | RESPIRATORY_TRACT | Status: DC | PRN
  Administered 2013-06-01: 2.5 mg via RESPIRATORY_TRACT
  Filled 2013-06-01: qty 3

## 2013-06-01 MED ORDER — SODIUM CHLORIDE 0.9 % IJ SOLN
3.0000 mL | Freq: Two times a day (BID) | INTRAMUSCULAR | Status: DC
  Administered 2013-06-01 – 2013-06-03 (×4): 3 mL via INTRAVENOUS

## 2013-06-01 MED ORDER — LEVOFLOXACIN IN D5W 750 MG/150ML IV SOLN
750.0000 mg | Freq: Once | INTRAVENOUS | Status: AC
  Administered 2013-06-01: 750 mg via INTRAVENOUS
  Filled 2013-06-01: qty 150

## 2013-06-01 MED ORDER — RAMELTEON 8 MG PO TABS
8.0000 mg | ORAL_TABLET | Freq: Every day | ORAL | Status: DC
  Administered 2013-06-01: 8 mg via ORAL
  Filled 2013-06-01 (×4): qty 1

## 2013-06-01 MED ORDER — SODIUM CHLORIDE 0.9 % IJ SOLN: 3.0000 mL | INTRAMUSCULAR | Status: DC | PRN

## 2013-06-01 MED ORDER — HALOPERIDOL LACTATE 5 MG/ML IJ SOLN
1.0000 mg | Freq: Four times a day (QID) | INTRAMUSCULAR | Status: DC | PRN
  Administered 2013-06-01 – 2013-06-03 (×3): 1 mg via INTRAVENOUS
  Filled 2013-06-01 (×3): qty 1

## 2013-06-01 MED ORDER — ACETAMINOPHEN 325 MG PO TABS
650.0000 mg | ORAL_TABLET | Freq: Once | ORAL | Status: AC
  Administered 2013-06-01: 650 mg via ORAL
  Filled 2013-06-01: qty 2

## 2013-06-01 MED ORDER — VANCOMYCIN HCL IN DEXTROSE 1-5 GM/200ML-% IV SOLN
1000.0000 mg | INTRAVENOUS | Status: DC
  Administered 2013-06-01 – 2013-06-04 (×4): 1000 mg via INTRAVENOUS
  Filled 2013-06-01 (×4): qty 200

## 2013-06-01 NOTE — Progress Notes (Signed)
ANTIBIOTIC CONSULT NOTE - INITIAL  Pharmacy Consult for Vancomycin  Indication: pneumonia  No Known Allergies  Patient Measurements: Height: 6\' 1"  (185.4 cm) (Simultaneous filing. User may not have seen previous data.) Weight: 158 lb (71.668 kg) (Simultaneous filing. User may not have seen previous data.) IBW/kg (Calculated) : 79.9  Vital Signs: Temp: 100.3 F (37.9 C) (04/04 0105) Temp src: Oral (04/04 0105) BP: 117/68 mmHg (04/04 0400) Pulse Rate: 61 (04/04 0400) Intake/Output from previous day: 04/03 0701 - 04/04 0700 In: 750 [IV Piggyback:750] Out: -  Intake/Output from this shift: Total I/O In: 750 [IV Piggyback:750] Out: -   Labs:  Recent Labs  06/22/2013 0117 06/22/2013 0130  WBC 10.0  --   HGB 9.7* 9.2*  PLT 255  --   CREATININE 1.63* 1.80*   Estimated Creatinine Clearance: 33.7 ml/min (by C-G formula based on Cr of 1.8). No results found for this basename: VANCOTROUGH, VANCOPEAK, VANCORANDOM, GENTTROUGH, GENTPEAK, GENTRANDOM, TOBRATROUGH, TOBRAPEAK, TOBRARND, AMIKACINPEAK, AMIKACINTROU, AMIKACIN,  in the last 72 hours   Microbiology: No results found for this or any previous visit (from the past 720 hour(s)).  Medical History: Past Medical History  Diagnosis Date  . Memory loss   . Atrial fibrillation   . Diverticulosis   . Gastroesophageal reflux   . Hyperlipidemia   . Benign prostatic hypertrophy   . Herpes zoster   . Chronic kidney disease   . History of orchiectomy, unilateral    Assessment: 80 y/o M here with shortness of breath, CXR with PNA, to start vancomycin per pharmacy, WBC 10, Tmax 100.3, Scr 1.80, other labs as above.   Goal of Therapy:  Vancomycin trough level 15-20 mcg/ml  Plan:  -Vancomycin 1000 mg IV q24h -CTX/Azithro per MD -Trend WBC, temp, renal function -De-escalate as able -Drug levels as indicated   Abran DukeLedford, Deane Melick 06/07/2013,4:16 AM

## 2013-06-01 NOTE — Evaluation (Signed)
Clinical/Bedside Swallow Evaluation Patient Details  Name: Steven MooresCharles J Thompson MRN: [id number] Date of Birth: Jul 20, 1932  Today's Date: 06/02/2013 Time: [id number] SLP Time Calculation (min): 25 min  Past Medical History:  Past Medical History  Diagnosis Date  . Memory loss   . Atrial fibrillation   . Diverticulosis   . Gastroesophageal reflux   . Hyperlipidemia   . Benign prostatic hypertrophy   . Herpes zoster   . Chronic kidney disease   . History of orchiectomy, unilateral    Past Surgical History:  Past Surgical History  Procedure Laterality Date  . Hernia repair    . Orchiectomy     HPI:  80 y.o. male with Past medical history of dementia, A. fib, GERD, BPH.     Assessment / Plan / Recommendation Clinical Impression  Pt demonstrated one throat clear at the end of the exam on one straw sip of thin liquid. Pt on venti mask; O2s remained relatively stable with foods but dropped below 85 on 2 occasions with thin liquids. Swallow was timely. Would like to do MBS to objectively evaluate swallow and rule out aspiration, given pt's current R lobe pneumonia, MD in agreement. Rx that pt remain NPO until study is complete. May allow ice chips following oral care.     Aspiration Risk  Moderate    Diet Recommendation NPO;Ice chips PRN after oral care   Medication Administration: Via alternative means    Other  Recommendations Recommended Consults: MBS Oral Care Recommendations: Oral care Q4 per protocol   Follow Up Recommendations       Frequency and Duration        Pertinent Vitals/Pain n/a    SLP Swallow Goals     Swallow Study Prior Functional Status       General HPI: 80 y.o. male with Past medical history of dementia, A. fib, GERD, BPH.   Type of Study: Bedside swallow evaluation Diet Prior to this Study: NPO Temperature Spikes Noted: Yes Respiratory Status: venti-mask History of Recent Intubation: No Behavior/Cognition: Alert;Cooperative;Pleasant mood Oral  Cavity - Dentition: Adequate natural dentition Self-Feeding Abilities: Able to feed self Patient Positioning: Upright in bed Baseline Vocal Quality: Clear Volitional Cough: Strong Volitional Swallow: Able to elicit    Oral/Motor/Sensory Function Overall Oral Motor/Sensory Function: Appears within functional limits for tasks assessed Labial ROM: Within Functional Limits Labial Symmetry: Within Functional Limits Labial Strength: Within Functional Limits Lingual ROM: Within Functional Limits Lingual Symmetry: Within Functional Limits Lingual Strength: Within Functional Limits   Ice Chips Ice chips: Not tested   Thin Liquid Thin Liquid: Impaired Presentation: Cup;Straw Pharyngeal  Phase Impairments: Throat Clearing - Immediate;Other (comments) (on one straw sip)    Nectar Thick Nectar Thick Liquid: Not tested   Honey Thick Honey Thick Liquid: Not tested   Puree Puree: Within functional limits   Solid   GO    Solid: Within functional limits Presentation: Self Krystal ClarkFed       Oleksiak, Amy K, MA, CCC-SLP 05/31/2013,9:12 AM

## 2013-06-01 NOTE — Procedures (Signed)
Objective Swallowing Evaluation: Modified Barium Swallowing Study  Patient Details  Name: Steven MooresCharles J Beamer MRN: [id number] Date of Birth: 09/19/1932  Today's Date: 06/21/2013 Time: 1220-1300 SLP Time Calculation (min): 40 min  Past Medical History:  Past Medical History  Diagnosis Date  . Memory loss   . Atrial fibrillation   . Diverticulosis   . Gastroesophageal reflux   . Hyperlipidemia   . Benign prostatic hypertrophy   . Herpes zoster   . Chronic kidney disease   . History of orchiectomy, unilateral    Past Surgical History:  Past Surgical History  Procedure Laterality Date  . Hernia repair    . Orchiectomy     HPI:  80 y.o. male with Past medical history of dementia, A. fib, GERD, BPH.       Assessment / Plan / Recommendation Clinical Impression  Dysphagia Diagnosis: Within Functional Limits Clinical impression: Pt demonstrated a timely swallow with no penetration/ aspiration. Pt struggled with pill and was unable to move it posteriorly in mouth. Rx that meds be crushed with puree. Also rx regular diet w/ thin liquids; given hx of GERD pt should be seated upright during meals and for at least 30 minutes after. Speech will sign off at this time; please re-consult if needed.    Treatment Recommendation  No treatment recommended at this time    Diet Recommendation Regular;Thin liquid   Liquid Administration via: Cup;Straw Medication Administration: Crushed with puree Supervision: Patient able to self feed Postural Changes and/or Swallow Maneuvers: Seated upright 90 degrees;Upright 30-60 min after meal    Other  Recommendations Oral Care Recommendations: Oral care BID   Follow Up Recommendations  None    Frequency and Duration        Pertinent Vitals/Pain N/A    SLP Swallow Goals     General HPI: 80 y.o. male with Past medical history of dementia, A. fib, GERD, BPH.   Type of Study: Modified Barium Swallowing Study Reason for Referral: Objectively  evaluate swallowing function Previous Swallow Assessment: BSE this morning Diet Prior to this Study: NPO Temperature Spikes Noted: Yes Respiratory Status: venti-mask History of Recent Intubation: No Behavior/Cognition: Alert;Cooperative;Pleasant mood Oral Cavity - Dentition: Adequate natural dentition Oral Motor / Sensory Function: Within functional limits Self-Feeding Abilities: Able to feed self Patient Positioning: Upright in chair Baseline Vocal Quality: Clear Volitional Cough: Strong Volitional Swallow: Able to elicit Anatomy: Within functional limits Pharyngeal Secretions: Not observed secondary MBS    Reason for Referral Objectively evaluate swallowing function   Oral Phase Oral Preparation/Oral Phase Oral Phase: Impaired Oral - Thin Oral - Thin Cup: Within functional limits Oral - Thin Straw: Within functional limits Oral - Solids Oral - Puree: Within functional limits Oral - Regular: Within functional limits Oral - Pill: Holding of bolus Oral Phase - Comment Oral Phase - Comment: Could not move pill posteriorly in mouth   Pharyngeal Phase Pharyngeal Phase Pharyngeal Phase: Within functional limits  Cervical Esophageal Phase    GO    Cervical Esophageal Phase Cervical Esophageal Phase: Karlyn AgeeWFL         Oleksiak, Amy K, CCC-SLP 05/29/2013, 1:28 PM

## 2013-06-01 NOTE — ED Notes (Signed)
Per [facility] - pt w/ acute onset of shortness of breath x2-3 hrs pta - pt initially found to have pulse ox of 82% and hot to touch, pt w/ recent non-productive cough and hx of pneumonia. Pt w/ congestion/rhonchi, EMS placed pt on CPAP en route and increased pulse ox to 94%. Pt A&Ox4 on arrival

## 2013-06-01 NOTE — Progress Notes (Signed)
Pt. Increasingly confused and attempting to get OOB.  Pt. Was tachypneic and not compliant with any medical regimen or instruction. Is not belligerent, but insistent that he needs to "go somewhere to die."  States, "I want to die, I know I'm going to die!"  1 mg. Of Hadol given as per MD order.  Pt. Supported.  Will require close monitoring and bed alarm to avoid falls.

## 2013-06-01 NOTE — ED Notes (Signed)
Alert, NAD, calm, interactive, resps e/u, speaking in clear complete sentences, VSS, "feel better", (denies: pain, sob, nausea or dizziness), tolerating NRB at 10L, abx infused, NSL flushed.

## 2013-06-01 NOTE — ED Provider Notes (Signed)
CSN: [id number]     Arrival date & time 06/03/2013  0058 History   First MD Initiated Contact with Patient 2013/09/10 0112     Chief Complaint  Patient presents with  . Shortness of Breath     (Consider location/radiation/quality/duration/timing/severity/associated sxs/prior Treatment) HPI Limited history provided by patient. Cough and difficulty breathing started a few days ago with productive sputum, worse tonight and brought in by EMS. Patient denies any chest pain. No abdominal pain. Reported pulse ox in the 80s and placed on CPAP. Wife bedside states he has been more confused tonight at home with difficulty breathing. No history of pulmonary problems otherwise. Level V caveat applies  Past Medical History  Diagnosis Date  . Memory loss   . Atrial fibrillation   . Diverticulosis   . Gastroesophageal reflux   . Hyperlipidemia   . Benign prostatic hypertrophy   . Herpes zoster   . Chronic kidney disease   . History of orchiectomy, unilateral    Past Surgical History  Procedure Laterality Date  . Hernia repair    . Orchiectomy     Family History  Problem Relation Age of Onset  . Stroke Mother   . Heart attack Father    History  Substance Use Topics  . Smoking status: Never Smoker   . Smokeless tobacco: Not on file  . Alcohol Use: No    Review of Systems  Unable to perform ROS  level V caveat confusion    Allergies  Review of patient's allergies indicates no known allergies.  Home Medications   Current Outpatient Rx  Name  Route  Sig  Dispense  Refill  . amLODipine (NORVASC) 10 MG tablet      TAKE 1 TABLET BY MOUTH EVERY DAY FOR BLOOD PRESSURE   90 tablet   3   . amLODipine (NORVASC) 10 MG tablet   Oral   Take 10 mg by mouth daily.         Marland Kitchen. diltiazem (CARDIZEM CD) 120 MG 24 hr capsule      TAKE 1 TABLET EVERY DAY BEFORE A MEAL   90 capsule   2   . finasteride (PROSCAR) 5 MG tablet   Oral   Take 5 mg by mouth daily.         . flecainide  (TAMBOCOR) 100 MG tablet      TAKE 1 TABLET TWICE A DAY   180 tablet   2   . Methylfol-Methylcob-Acetylcyst (CEREFOLIN NAC PO)   Oral   Take 1 capsule by mouth daily.         . rosuvastatin (CRESTOR) 10 MG tablet   Oral   Take 10 mg by mouth daily.         Marland Kitchen. warfarin (COUMADIN) 5 MG tablet   Oral   Take 5 mg by mouth daily.          BP 106/65  Pulse 68  Temp(Src) 100.3 F (37.9 C) (Oral)  Resp 27  Ht 6\' 1"  (1.854 m)  Wt 158 lb (71.668 kg)  BMI 20.85 kg/m2  SpO2 97% Physical Exam  Constitutional: He appears well-developed and well-nourished.  HENT:  Head: Normocephalic and atraumatic.  Eyes: EOM are normal. Pupils are equal, round, and reactive to light.  Neck: Neck supple.  Cardiovascular: Normal rate, regular rhythm and intact distal pulses.   Pulmonary/Chest: No stridor.  Tachypnea with coarse bilateral breath sounds  Abdominal: Soft. He exhibits no distension.  Musculoskeletal: Normal range of motion. He exhibits  no edema and no tenderness.  Neurological:  Awake and alert with speech clear, oriented to self, moving all extremities  Skin: Skin is warm and dry.    ED Course  Procedures (including critical care time) Labs Review Labs Reviewed  CBC - Abnormal; Notable for the following:    RBC 3.46 (*)    Hemoglobin 9.7 (*)    HCT 28.8 (*)    All other components within normal limits  PROTIME-INR - Abnormal; Notable for the following:    Prothrombin Time 35.1 (*)    INR 3.67 (*)    All other components within normal limits  COMPREHENSIVE METABOLIC PANEL - Abnormal; Notable for the following:    Glucose, Bld 138 (*)    BUN 26 (*)    Creatinine, Ser 1.63 (*)    Albumin 2.6 (*)    GFR calc non Af Amer 38 (*)    GFR calc Af Amer 45 (*)    All other components within normal limits  I-STAT CHEM 8, ED - Abnormal; Notable for the following:    Sodium 136 (*)    Creatinine, Ser 1.80 (*)    Glucose, Bld 137 (*)    Hemoglobin 9.2 (*)    HCT 27.0 (*)     All other components within normal limits  TROPONIN I  BLOOD GAS, ARTERIAL  I-STAT CG4 LACTIC ACID, ED   Imaging Review Dg Chest Portable 1 View  06-Jun-2013   CLINICAL DATA:  Shortness of breath.  EXAM: PORTABLE CHEST - 1 VIEW  COMPARISON:  Chest radiograph performed 05/08/2013  FINDINGS: The lungs are relatively well expanded. There is relatively diffuse right-sided airspace opacification, and more mild left basilar airspace opacity. This may reflect asymmetric pulmonary edema or multifocal pneumonia. A small right pleural effusion is seen. No pneumothorax is identified.  The cardiomediastinal silhouette is borderline enlarged. No acute osseous abnormalities are seen.  IMPRESSION: 1. Relatively diffuse right-sided airspace opacification, and more mild left basilar airspace opacity. This may reflect asymmetric pulmonary edema or multifocal pneumonia. Small right pleural effusion seen. 2. Borderline cardiomegaly noted.   Electronically Signed   By: Roanna Raider M.D.   On: 06-Jun-2013 02:19   Switched to nonrebreather mask and maintaining sats in the upper 90s  IV Levaquin provided Discussed with Dr. Allena Katz, plan admit step down unit  MDM   Diagnosis: Community-acquired pneumonia, elevated INR  Presents tachypnea, confusion, fever, hypoxia, with productive cough Labs and imaging obtained and reviewed as above. Significant oxygen requirement. Antibiotics provided and admit step down unit  Sunnie Nielsen, MD 06-06-13 [phone]

## 2013-06-01 NOTE — Progress Notes (Signed)
Patient attempting to get OOB on own.  Tachypneic at 38 - 40.  Was on NRB mask and sats 100%.  Increasingly confused.  Restless.  MD notified.  See NO

## 2013-06-01 NOTE — H&P (Signed)
[facility] History and Physical  Patient: Steven Thompson  ZOX:[id number]  DOB: [birth year]  DOS: the patient was seen and examined on 06-09-2013 PCP: Thora Lance, MD  Chief Complaint: Shortness of breath  HPI: Steven Thompson is a 80 y.o. male with Past medical history of dementia, A. fib, GERD, BPH. The patient was brought in by his wife. Today the patient was at his baseline after dinner. Later on when they were trying to sleep the patient started complaining of shortness of breath. This was followed by gargling breathing sound. He also had cough with expectoration. The wife checked his oxygen saturation with her on pulse oximeter and he was found 55% on room air and she called EMS and they brought the patient to the hospital. The patient denies any complaint of chest pain or shortness of breath at the time of my evaluation. The wife denies any complaint of any fever, chills, headache, cough, chest pain, palpitation, shortness of breath, orthopnea, PND, nausea, vomiting, abdominal pain, diarrhea, constipation, active bleeding, burning urination, dizziness, pedal edema,  focal neurological deficit during the day.  She denies any episodes of aspiration. No prior history of similar episodes.  The patient is coming from home. And at her baseline dependent for most of his  ADL.  Review of Systems: as mentioned in the history of present illness.  A Comprehensive review of the other systems is negative.  Past Medical History  Diagnosis Date  . Memory loss   . Atrial fibrillation   . Diverticulosis   . Gastroesophageal reflux   . Hyperlipidemia   . Benign prostatic hypertrophy   . Herpes zoster   . Chronic kidney disease   . History of orchiectomy, unilateral    Past Surgical History  Procedure Laterality Date  . Hernia repair    . Orchiectomy     Social History:  reports that he has never smoked. He does not have any smokeless tobacco history on file. He reports that he  does not drink alcohol or use illicit drugs.  No Known Allergies  Family History  Problem Relation Age of Onset  . Stroke Mother   . Heart attack Father     Prior to Admission medications   Medication Sig Start Date End Date Taking? Authorizing Provider  amLODipine (NORVASC) 10 MG tablet TAKE 1 TABLET BY MOUTH EVERY DAY FOR BLOOD PRESSURE 02/06/13  Yes Corky Crafts, MD  amLODipine (NORVASC) 10 MG tablet Take 10 mg by mouth daily.   Yes Historical Provider, MD  diltiazem (CARDIZEM CD) 120 MG 24 hr capsule TAKE 1 TABLET EVERY DAY BEFORE A MEAL 02/05/13  Yes Corky Crafts, MD  finasteride (PROSCAR) 5 MG tablet Take 5 mg by mouth daily.   Yes Historical Provider, MD  flecainide (TAMBOCOR) 100 MG tablet TAKE 1 TABLET TWICE A DAY 02/27/13  Yes Corky Crafts, MD  Methylfol-Methylcob-Acetylcyst (CEREFOLIN NAC PO) Take 1 capsule by mouth daily.   Yes Historical Provider, MD  rosuvastatin (CRESTOR) 10 MG tablet Take 10 mg by mouth daily.   Yes Historical Provider, MD  warfarin (COUMADIN) 5 MG tablet Take 5 mg by mouth daily.   Yes Historical Provider, MD    Physical Exam: Filed Vitals:   June 09, 2013 0102 2013/06/09 0105 06/09/13 0145 June 09, 2013 0230  BP:  160/124 107/75 106/65  Pulse:  78 72 68  Temp:  100.3 F (37.9 C)    TempSrc:  Oral    Resp:  27 27 27   Height:  6\' 1"  (1.854 m)    Weight:  71.668 kg (158 lb)    SpO2: 82% 100% 93% 97%    General: Alert, Awake and Oriented to Time, Place and Person. Appear in marked distress Eyes: PERRLENT: Oral Mucosa clear moist. Neck: No JVD Cardiovascular: S1 and S2 Present, aortic systolic Murmur, Peripheral Pulses Present Respiratory: Bilateral Air entry equal and Decreased, bilateral rhonchi and Crackles right more than left, no wheezes Abdomen: Bowel Sound Present, Soft and Non tender Skin: No Rash Extremities: No Pedal edema, no calf tenderness Neurologic: Grossly Unremarkable.  Labs on Admission:  CBC:  Recent Labs Lab  2013/06/20 0117 06/20/2013 0130  WBC 10.0  --   HGB 9.7* 9.2*  HCT 28.8* 27.0*  MCV 83.2  --   PLT 255  --     CMP     Component Value Date/Time   NA 136* 06-20-2013 0130   K 3.9 06/20/13 0130   CL 103 20-Jun-2013 0130   CO2 21 June 20, 2013 0117   GLUCOSE 137* June 20, 2013 0130   BUN 23 June 20, 2013 0130   CREATININE 1.80* 06-20-2013 0130   CALCIUM 8.4 06-20-13 0117   PROT 6.5 June 20, 2013 0117   ALBUMIN 2.6* 20-Jun-2013 0117   AST 11 06-20-13 0117   ALT 9 Jun 20, 2013 0117   ALKPHOS 82 06-20-2013 0117   BILITOT 0.3 06/20/2013 0117   GFRNONAA 38* June 20, 2013 0117   GFRAA 45* 2013/06/20 0117    No results found for this basename: LIPASE, AMYLASE,  in the last 168 hours No results found for this basename: AMMONIA,  in the last 168 hours   Recent Labs Lab 2013-06-20 0118  TROPONINI <0.30   BNP (last 3 results) No results found for this basename: PROBNP,  in the last 8760 hours  Radiological Exams on Admission: Dg Chest Portable 1 View  2013/06/20   CLINICAL DATA:  Shortness of breath.  EXAM: PORTABLE CHEST - 1 VIEW  COMPARISON:  Chest radiograph performed 05/08/2013  FINDINGS: The lungs are relatively well expanded. There is relatively diffuse right-sided airspace opacification, and more mild left basilar airspace opacity. This may reflect asymmetric pulmonary edema or multifocal pneumonia. A small right pleural effusion is seen. No pneumothorax is identified.  The cardiomediastinal silhouette is borderline enlarged. No acute osseous abnormalities are seen.  IMPRESSION: 1. Relatively diffuse right-sided airspace opacification, and more mild left basilar airspace opacity. This may reflect asymmetric pulmonary edema or multifocal pneumonia. Small right pleural effusion seen. 2. Borderline cardiomegaly noted.   Electronically Signed   By: Roanna Raider M.D.   On: 2013/06/20 02:19    EKG: Independently reviewed. nonspecific ST and T waves changes, sinus tachycardia.  Assessment/Plan Principal Problem:   Acute  respiratory distress syndrome (ARDS) Active Problems:   Alzheimer's disease   Atrial fibrillation   Gastroesophageal reflux   Benign prostatic hypertrophy   CAP (community acquired pneumonia)   1. Acute respiratory distress syndrome (ARDS) The patient is presenting with complaints of shortness of breath and cough.. he was 55% on room air at home. He is found to have bilateral right more than left infiltrate. He is on nonrebreather with 100% FiO2 and he is ABG is showing PO2 of 109, does his PaO2/FiO2 ratio is 109, meeting criteria for moderate ARDS. Patient is DNR/DNI as per my discussion with the wife was the next of kin. This the patient will be admitted to the hospital in stepdown unit. He will be started on broad-spectrum antibiotics with IV ceftriaxone and azithromycin and IV vancomycin.,  Blood cultures, sputum cultures, urine antigens, influenza PCR. I would also continue him on Tamiflu. That is a high suspicion for he has aspiration pneumonia which would be adequately covered with ceftriaxone. Speech evaluation will be requested. IV hydration and oxygen as needed as well as BiPAP as needed.  2. GERD Protonix every 24 hours  3. Atrial fibrillation INR therapeutic patient appears sinus at present Warfarin per pharmacy  4. Dementia Patient does not have any focal neurological deficit. He does have baseline Alzheimer's. At present does not appear agitated. Continue to monitor  DVT Prophylaxis: mechanical compression device Nutrition: N.p.o. until swallowing evaluation  Code Status: DNR/DNI  Family Communication: Wife was present at bedside, opportunity was given to ask question and all questions were answered satisfactorily at the time of interview. Disposition: Admitted to inpatient in step-down unit.  Author: Lynden OxfordPranav Leverne Amrhein, MD [facility] Pager: [phone] 06/03/2013, 3:29 AM    If 7PM-7AM, please contact night-coverage [email or web address] Password [email or web address]

## 2013-06-01 NOTE — Progress Notes (Signed)
INITIAL NUTRITION ASSESSMENT  DOCUMENTATION CODES Per approved criteria  -Not Applicable   INTERVENTION:  Await MBSS results/SLP recommendations, add interventions accordingly RD to follow for nutrition care plan  NUTRITION DIAGNOSIS: Inadequate oral intake related to inability to eat as evidenced by NPO status  Goal: Pt to meet >/= 90% of their estimated nutrition needs   Monitor:  PO diet advancement vs need for TF, weight, labs, I/O's  Reason for Assessment: Malnutrition Screening Tool Report  80 y.o. male  Admitting Dx: Acute respiratory distress syndrome (ARDS)  ASSESSMENT: Patient with PMH of dementia, A. fib, GERD, BPH; presented with shortness of breath; pt dx with ARDS due to community acquired pneumonia.  RD unable to obtain nutrition hx -- pt currently in DIAGNOSTIC RADIOLOGY; per admission nutrition screen, pt reported eating poorly because of a decreased appetite and unintentional weight loss; s/p bedside swallow evaluation; SLP recommending MBSS to r/o aspiration.  RD suspects possible malnutrition, however, unable to identify at this time.  Height: Ht Readings from Last 1 Encounters:  06/02/2013 6\' 1"  (1.854 m)    Weight: Wt Readings from Last 1 Encounters:  06/09/2013 156 lb 15.5 oz (71.2 kg)    Ideal Body Weight: 184 lb  % Ideal Body Weight: 84%  Wt Readings from Last 10 Encounters:  06/25/2013 156 lb 15.5 oz (71.2 kg)  04/17/13 157 lb 6.4 oz (71.396 kg)  12/17/12 163 lb (73.936 kg)  03/07/12 171 lb (77.565 kg)    Usual Body Weight: 163 lb  % Usual Body Weight: 95%  BMI:  Body mass index is 20.71 kg/(m^2).  Estimated Nutritional Needs: Kcal: 1750-1950 Protein: 80-90 gm Fluid: 1.7-1.9 L  Skin: Intact  Diet Order: NPO  EDUCATION NEEDS: -No education needs identified at this time   Intake/Output Summary (Last 24 hours) at 06/10/2013 1327 Last data filed at 06/12/2013 1100  Gross per 24 hour  Intake 1438.75 ml  Output    300 ml  Net  1138.75 ml   Labs:   Recent Labs Lab 06/15/2013 0117 06/08/2013 0130  NA 138 136*  K 4.2 3.9  CL 103 103  CO2 21  --   BUN 26* 23  CREATININE 1.63* 1.80*  CALCIUM 8.4  --   GLUCOSE 138* 137*    CBG (last 3)   Recent Labs  06/25/2013 0834  GLUCAP 140*    Scheduled Meds: . azithromycin  500 mg Intravenous Q24H  . cefTRIAXone (ROCEPHIN)  IV  1 g Intravenous Q24H  . oseltamivir  30 mg Oral BID  . vancomycin  1,000 mg Intravenous Q24H    Continuous Infusions: . sodium chloride 75 mL/hr (06/03/2013 0549)    Past Medical History  Diagnosis Date  . Memory loss   . Atrial fibrillation   . Diverticulosis   . Gastroesophageal reflux   . Hyperlipidemia   . Benign prostatic hypertrophy   . Herpes zoster   . Chronic kidney disease   . History of orchiectomy, unilateral     Past Surgical History  Procedure Laterality Date  . Hernia repair    . Orchiectomy      Maureen ChattersKatie Preslyn Warr, RD, LDN Pager #: [phone] After-Hours Pager #: [phone]

## 2013-06-01 NOTE — Progress Notes (Signed)
[facility] PROGRESS NOTE Interim History: 80 y.o. male with Past medical history of dementia, A. fib, GERD, BPH.  The patient was brought in by his wife. Today the patient was at his baseline after dinner. Later on when they were trying to sleep the patient started complaining of shortness of breath. This was followed by gargling breathing sound. He also had cough with expectoration. The wife checked his oxygen saturation with her on pulse oximeter and he was found 55% on room air and she called EMS and they brought the patient to the hospital. He is on nonrebreather with 100% FiO2 and he is ABG is showing PO2 of 109, does his PaO2/FiO2 ratio is 109,   Assessment/Plan: Acute respiratory distress syndrome (ARDS) due to  CAP (community acquired pneumonia) - IV Vanc Azithro and rocephin. - Still requiring High flow oxygen,sat >90 on venturi mask. Afebrile. - cultures pending. - poor prognosis, use morphine low dose. Bipap RPN  AKI: - On IV fluids, Cr is worsening. - Bp stable, check a lactate.  Atrial fibrillation - rate controlled.  Alzheimer's disease - cont home meds.  Hypernatremia: - cont IV fluids,  Check U Sodium. - b-met in am.  Code Status: DNR/DNI Family Communication: wofe  Disposition Plan: inpatinet   Consultants:  none  Procedures:  CXR  Antibiotics:  Vanc rocpehin azithro  HPI/Subjective: No complains relates his breathing is not any worst  Objective: Filed Vitals:   06/13/2013 0330 05/30/2013 0345 06/24/2013 0400 06/14/2013 0500  BP: 112/63 114/71 117/68 109/65  Pulse: 65 63 61 61  Temp:    96.3 F (35.7 C)  TempSrc:    Axillary  Resp: 21 22 19 19   Height:      Weight:    71.2 kg (156 lb 15.5 oz)  SpO2: 90% 98% 100% 90%    Intake/Output Summary (Last 24 hours) at 06/07/2013 0721 Last data filed at 06/12/2013 0415  Gross per 24 hour  Intake    750 ml  Output      0 ml  Net    750 ml   Filed Weights   06/14/2013 0105 06/06/2013 0500    Weight: 71.668 kg (158 lb) 71.2 kg (156 lb 15.5 oz)    Exam:  General: Alert, awake, oriented x3, in no acute distress.  HEENT: No bruits, no goiter. -JVD Heart: Regular rate and rhythm, without murmurs, rubs, gallops.  Lungs: Good air movement, crackles B/l at bases Abdomen: Soft, nontender, nondistended, positive bowel sounds.     Data Reviewed: Basic Metabolic Panel:  Recent Labs Lab 05/31/2013 0117 06/14/2013 0130  NA 138 136*  K 4.2 3.9  CL 103 103  CO2 21  --   GLUCOSE 138* 137*  BUN 26* 23  CREATININE 1.63* 1.80*  CALCIUM 8.4  --    Liver Function Tests:  Recent Labs Lab 06/14/2013 0117  AST 11  ALT 9  ALKPHOS 82  BILITOT 0.3  PROT 6.5  ALBUMIN 2.6*   No results found for this basename: LIPASE, AMYLASE,  in the last 168 hours No results found for this basename: AMMONIA,  in the last 168 hours CBC:  Recent Labs Lab 06/03/2013 0117 06/05/2013 0130  WBC 10.0  --   HGB 9.7* 9.2*  HCT 28.8* 27.0*  MCV 83.2  --   PLT 255  --    Cardiac Enzymes:  Recent Labs Lab 06/06/2013 0118  TROPONINI <0.30   BNP (last 3 results) No results found for this basename: PROBNP,  in the last 8760 hours CBG: No results found for this basename: GLUCAP,  in the last 168 hours  No results found for this or any previous visit (from the past 240 hour(s)).   Studies: Dg Chest Portable 1 View  06/08/2013   CLINICAL DATA:  Shortness of breath.  EXAM: PORTABLE CHEST - 1 VIEW  COMPARISON:  Chest radiograph performed 05/08/2013  FINDINGS: The lungs are relatively well expanded. There is relatively diffuse right-sided airspace opacification, and more mild left basilar airspace opacity. This may reflect asymmetric pulmonary edema or multifocal pneumonia. A small right pleural effusion is seen. No pneumothorax is identified.  The cardiomediastinal silhouette is borderline enlarged. No acute osseous abnormalities are seen.  IMPRESSION: 1. Relatively diffuse right-sided airspace  opacification, and more mild left basilar airspace opacity. This may reflect asymmetric pulmonary edema or multifocal pneumonia. Small right pleural effusion seen. 2. Borderline cardiomegaly noted.   Electronically Signed   By: Garald Balding M.D.   On: 06/27/2013 02:19    Scheduled Meds: . azithromycin  500 mg Intravenous Q24H  . cefTRIAXone (ROCEPHIN)  IV  1 g Intravenous Q24H  . oseltamivir  30 mg Oral BID  . vancomycin  1,000 mg Intravenous Q24H   Continuous Infusions: . sodium chloride 75 mL/hr (06/06/2013 0549)     Charlynne Cousins  [facility] Pager [phone]. If 8PM-8AM, please contact night-coverage at [email or web address], password [facility] 06/03/2013, 7:21 AM  LOS: 0 days

## 2013-06-02 LAB — LEGIONELLA ANTIGEN, URINE: LEGIONELLA ANTIGEN, URINE: NEGATIVE

## 2013-06-02 LAB — PROTIME-INR
INR: 5.21 (ref 0.00–1.49)
PROTHROMBIN TIME: 46.8 s — AB (ref 11.6–15.2)

## 2013-06-02 LAB — BASIC METABOLIC PANEL
BUN: 20 mg/dL (ref 6–23)
CALCIUM: 8.2 mg/dL — AB (ref 8.4–10.5)
CO2: 21 mEq/L (ref 19–32)
Chloride: 99 mEq/L (ref 96–112)
Creatinine, Ser: 1.33 mg/dL (ref 0.50–1.35)
GFR calc Af Amer: 57 mL/min — ABNORMAL LOW (ref >90)
GFR, EST NON AFRICAN AMERICAN: 49 mL/min — AB (ref >90)
Glucose, Bld: 132 mg/dL — ABNORMAL HIGH (ref 70–99)
Potassium: 4 mEq/L (ref 3.7–5.3)
Sodium: 137 mEq/L (ref 137–147)

## 2013-06-02 MED ORDER — MORPHINE SULFATE 2 MG/ML IJ SOLN
0.5000 mg | INTRAMUSCULAR | Status: DC | PRN
Start: 2013-06-02 — End: 2013-06-03
  Administered 2013-06-02 – 2013-06-03 (×2): 1 mg via INTRAVENOUS
  Filled 2013-06-02 (×2): qty 1

## 2013-06-02 MED ORDER — DILTIAZEM HCL ER COATED BEADS 120 MG PO CP24
120.0000 mg | ORAL_CAPSULE | Freq: Every day | ORAL | Status: DC
  Administered 2013-06-02 – 2013-06-03 (×2): 120 mg via ORAL
  Filled 2013-06-02 (×3): qty 1

## 2013-06-02 MED ORDER — METOPROLOL TARTRATE 1 MG/ML IV SOLN
2.5000 mg | INTRAVENOUS | Status: DC | PRN
  Administered 2013-06-02 (×2): 2.5 mg via INTRAVENOUS
  Filled 2013-06-02: qty 5

## 2013-06-02 MED ORDER — FUROSEMIDE 10 MG/ML IJ SOLN: 20.0000 mg | Freq: Every day | INTRAMUSCULAR | Status: DC

## 2013-06-02 MED ORDER — PHYTONADIONE 5 MG PO TABS
2.5000 mg | ORAL_TABLET | Freq: Once | ORAL | Status: AC
  Administered 2013-06-02: 2.5 mg via ORAL
  Filled 2013-06-02: qty 1

## 2013-06-02 MED ORDER — FUROSEMIDE 10 MG/ML IJ SOLN
40.0000 mg | Freq: Once | INTRAMUSCULAR | Status: AC
  Administered 2013-06-02: 40 mg via INTRAVENOUS
  Filled 2013-06-02: qty 4

## 2013-06-02 NOTE — Progress Notes (Signed)
Pt. Found to be unresponsive.  Color deeply cyanotic.  Breathing agonal.  After 4-5 minutes, regained color and was tachypneic at 40 and in RAF at 140-150.  Pronounced JVD.  MD notified.  Wife notified re:  Change in status.  Will continue to monitor.

## 2013-06-02 NOTE — Progress Notes (Addendum)
ANTICOAGULATION CONSULT NOTE - Initial Consult  Pharmacy Consult for Warfarin Indication: atrial fibrillation  No Known Allergies  Patient Measurements: Height: 6\' 1"  (185.4 cm) (Simultaneous filing. User may not have seen previous data.) Weight: 159 lb 6.3 oz (72.3 kg) IBW/kg (Calculated) : 79.9  Vital Signs: Temp: 99 F (37.2 C) (04/05 0749) Temp src: Oral (04/05 0749) BP: 119/73 mmHg (04/05 0749) Pulse Rate: 113 (04/05 0749)  Labs:  Recent Labs  05/31/13 1029 06/12/2013 0117 05/31/2013 0118 06/07/2013 0130 06/02/13 0342  HGB  --  9.7*  --  9.2*  --   HCT  --  28.8*  --  27.0*  --   PLT  --  255  --   --   --   LABPROT  --  35.1*  --   --   --   INR 3.9 3.67*  --   --   --   CREATININE  --  1.63*  --  1.80* 1.33  TROPONINI  --   --  <0.30  --   --     Estimated Creatinine Clearance: 46.1 ml/min (by C-G formula based on Cr of 1.33).   Medical History: Past Medical History  Diagnosis Date  . Memory loss   . Atrial fibrillation   . Diverticulosis   . Gastroesophageal reflux   . Hyperlipidemia   . Benign prostatic hypertrophy   . Herpes zoster   . Chronic kidney disease   . History of orchiectomy, unilateral     Medications:  Scheduled:  . azithromycin  500 mg Intravenous Q24H  . cefTRIAXone (ROCEPHIN)  IV  1 g Intravenous Q24H  . furosemide  40 mg Intravenous Once  . ramelteon  8 mg Oral QHS  . sodium chloride  3 mL Intravenous Q12H  . vancomycin  1,000 mg Intravenous Q24H    Assessment: 80 y/o M here with shortness of breath, CXR with PNA, to start vancomycin per pharmacy, WBC 10, Tmax 100.3, Scr 1.80, other labs as above. Patient also with PMH: dementia, afib, GERD, BPH, HLD, CKD  Coumadin PTA AF 5mg  daily, INR 3.9 on admit 4/3. INR yesterday 3.67. Will order STAT PT/INR and reassess   No overt bleeding noted. Hgb 9.2, Hct 27, Plts 255  Goal of Therapy:  INR 2-3 Monitor platelets by anticoagulation protocol: Yes   Plan:  STAT PT/INR ordered F/u  results before dosing evening dose  Steven Thompson, PharmD Clinical Pharmacist - Resident Phone: [phone] Pager: [phone] 06/02/2013 11:53 AM   Addendum: elevated INR up to 5.21. No coumadin tonight. Vit K given.  Steven Thompson, PharmD, BCPS Clinical Pharmacist [phone] 06/02/2013, 5:27 PM

## 2013-06-02 NOTE — Progress Notes (Signed)
[facility] PROGRESS NOTE Interim History: 80 y.o. male with Past medical history of dementia, A. fib, GERD, BPH.  The patient was brought in by his wife. Today the patient was at his baseline after dinner. Later on when they were trying to sleep the patient started complaining of shortness of breath. This was followed by gargling breathing sound. He also had cough with expectoration. The wife checked his oxygen saturation with her on pulse oximeter and he was found 55% on room air and she called EMS and they brought the patient to the hospital. He is on nonrebreather with 100% FiO2 and he is ABG is showing PO2 of 109, does his PaO2/FiO2 ratio is 109,   Assessment/Plan: Acute respiratory distress syndrome (ARDS) due to  CAP (community acquired pneumonia) - Cont  IV Vanc Azithro and rocephin started on 4.4.2015. - Still requiring High flow oxygen,sat >90 on venturi mask. Afebrile. - cultures pending. - poor prognosis, use morphine low dose. Bipap RPN - Now with JVD and crackles on lung, give one dose of lasix. - haldol PRN for agitation.  AKI: - On IV fluids, Cr is improved to baseline - Bp stable.  Atrial fibrillation - tachy due to overload. Give lasix and see how it improves.  Alzheimer's disease - cont home meds.  Hypernatremia: - Improved with IV fluids.  Code Status: DNR/DNI Family Communication: wofe  Disposition Plan: inpatinet   Consultants:  none  Procedures:  CXR  Antibiotics:  Vanc rocpehin azithro  HPI/Subjective: No complains relates his breathing is not any worst  Objective: Filed Vitals:   06/02/13 0000 06/02/13 0334 06/02/13 0400 06/02/13 0500  BP: 132/73 128/90 128/90   Pulse: 116  123   Temp: 99.2 F (37.3 C)  98.2 F (36.8 C)   TempSrc: Oral  Oral   Resp: 30  26   Height:      Weight:    72.3 kg (159 lb 6.3 oz)  SpO2: 90%  92%     Intake/Output Summary (Last 24 hours) at 06/02/13 0708 Last data filed at 06/02/13 0600  Gross per 24 hour  Intake   1440 ml  Output   1500 ml  Net    -60 ml   Filed Weights   06/13/2013 0105 06/07/2013 0500 06/02/13 0500  Weight: 71.668 kg (158 lb) 71.2 kg (156 lb 15.5 oz) 72.3 kg (159 lb 6.3 oz)    Exam:  General: Alert, awake, oriented x3, in no acute distress.  HEENT: No bruits, no goiter. + JVD Heart: Regular rate and rhythm, without murmurs, rubs, gallops.  Lungs: Good air movement, crackles B/l at bases/ Abdomen: Soft, nontender, nondistended, positive bowel sounds.     Data Reviewed: Basic Metabolic Panel:  Recent Labs Lab 06/02/2013 0117 06/21/2013 0130 06/02/13 0342  NA 138 136* 137  K 4.2 3.9 4.0  CL 103 103 99  CO2 21  --  21  GLUCOSE 138* 137* 132*  BUN 26* 23 20  CREATININE 1.63* 1.80* 1.33  CALCIUM 8.4  --  8.2*   Liver Function Tests:  Recent Labs Lab 06/12/2013 0117  AST 11  ALT 9  ALKPHOS 82  BILITOT 0.3  PROT 6.5  ALBUMIN 2.6*   No results found for this basename: LIPASE, AMYLASE,  in the last 168 hours No results found for this basename: AMMONIA,  in the last 168 hours CBC:  Recent Labs Lab 06/11/2013 0117 06/02/2013 0130  WBC 10.0  --   HGB 9.7* 9.2*  HCT  28.8* 27.0*  MCV 83.2  --   PLT 255  --    Cardiac Enzymes:  Recent Labs Lab 24-Jun-2013 0118  TROPONINI <0.30   BNP (last 3 results) No results found for this basename: PROBNP,  in the last 8760 hours CBG:  Recent Labs Lab 2013-06-24 0834  GLUCAP 140*    Recent Results (from the past 240 hour(s))  MRSA PCR SCREENING     Status: None   Collection Time    June 24, 2013  5:48 AM      Result Value Ref Range Status   MRSA by PCR NEGATIVE  NEGATIVE Final   Comment:            The GeneXpert MRSA Assay (FDA     approved for NASAL specimens     only), is one component of a     comprehensive MRSA colonization     surveillance program. It is not     intended to diagnose MRSA     infection nor to guide or     monitor treatment for     MRSA infections.     Studies: Dg  Chest Portable 1 View  2013-06-24   CLINICAL DATA:  Shortness of breath.  EXAM: PORTABLE CHEST - 1 VIEW  COMPARISON:  Chest radiograph performed 05/08/2013  FINDINGS: The lungs are relatively well expanded. There is relatively diffuse right-sided airspace opacification, and more mild left basilar airspace opacity. This may reflect asymmetric pulmonary edema or multifocal pneumonia. A small right pleural effusion is seen. No pneumothorax is identified.  The cardiomediastinal silhouette is borderline enlarged. No acute osseous abnormalities are seen.  IMPRESSION: 1. Relatively diffuse right-sided airspace opacification, and more mild left basilar airspace opacity. This may reflect asymmetric pulmonary edema or multifocal pneumonia. Small right pleural effusion seen. 2. Borderline cardiomegaly noted.   Electronically Signed   By: Roanna Raider M.D.   On: 06-24-13 02:19   Dg Swallowing Func-speech Pathology  Jun 24, 2013   Amy Cecille Aver, CCC-SLP     06/24/2013  1:29 PM Objective Swallowing Evaluation: Modified Barium Swallowing Study   Patient Details  Name: GARETH FITZNER MRN: [id number] Date of Birth: [birth year]  Today's Date: Jun 24, 2013 Time: 1220-1300 SLP Time Calculation (min): 40 min  Past Medical History:  Past Medical History  Diagnosis Date  . Memory loss   . Atrial fibrillation   . Diverticulosis   . Gastroesophageal reflux   . Hyperlipidemia   . Benign prostatic hypertrophy   . Herpes zoster   . Chronic kidney disease   . History of orchiectomy, unilateral    Past Surgical History:  Past Surgical History  Procedure Laterality Date  . Hernia repair    . Orchiectomy     HPI:  80 y.o. male with Past medical history of dementia, A. fib, GERD,  BPH.       Assessment / Plan / Recommendation Clinical Impression  Dysphagia Diagnosis: Within Functional Limits Clinical impression: Pt demonstrated a timely swallow with no  penetration/ aspiration. Pt struggled with pill and was unable to  move it posteriorly in mouth.  Rx that meds be crushed with puree.  Also rx regular diet w/ thin liquids; given hx of GERD pt should  be seated upright during meals and for at least 30 minutes after.  Speech will sign off at this time; please re-consult if needed.    Treatment Recommendation  No treatment recommended at this time    Diet Recommendation Regular;Thin liquid   Liquid Administration via:  Cup;Straw Medication Administration: Crushed with puree Supervision: Patient able to self feed Postural Changes and/or Swallow Maneuvers: Seated upright 90  degrees;Upright 30-60 min after meal    Other  Recommendations Oral Care Recommendations: Oral care BID   Follow Up Recommendations  None    Frequency and Duration        Pertinent Vitals/Pain N/A    SLP Swallow Goals     General HPI: 80 y.o. male with Past medical history of dementia,  A. fib, GERD, BPH.   Type of Study: Modified Barium Swallowing Study Reason for Referral: Objectively evaluate swallowing function Previous Swallow Assessment: BSE this morning Diet Prior to this Study: NPO Temperature Spikes Noted: Yes Respiratory Status: venti-mask History of Recent Intubation: No Behavior/Cognition: Alert;Cooperative;Pleasant mood Oral Cavity - Dentition: Adequate natural dentition Oral Motor / Sensory Function: Within functional limits Self-Feeding Abilities: Able to feed self Patient Positioning: Upright in chair Baseline Vocal Quality: Clear Volitional Cough: Strong Volitional Swallow: Able to elicit Anatomy: Within functional limits Pharyngeal Secretions: Not observed secondary MBS    Reason for Referral Objectively evaluate swallowing function   Oral Phase Oral Preparation/Oral Phase Oral Phase: Impaired Oral - Thin Oral - Thin Cup: Within functional limits Oral - Thin Straw: Within functional limits Oral - Solids Oral - Puree: Within functional limits Oral - Regular: Within functional limits Oral - Pill: Holding of bolus Oral Phase - Comment Oral Phase - Comment: Could not move pill  posteriorly in mouth   Pharyngeal Phase Pharyngeal Phase Pharyngeal Phase: Within functional limits  Cervical Esophageal Phase    GO    Cervical Esophageal Phase Cervical Esophageal Phase: Karlyn AgeeWFL         Oleksiak, Amy K, CCC-SLP 06/15/2013, 1:28 PM     Scheduled Meds: . azithromycin  500 mg Intravenous Q24H  . cefTRIAXone (ROCEPHIN)  IV  1 g Intravenous Q24H  . oseltamivir  30 mg Oral BID  . ramelteon  8 mg Oral QHS  . sodium chloride  3 mL Intravenous Q12H  . vancomycin  1,000 mg Intravenous Q24H   Continuous Infusions:     Marinda ElkFELIZ ORTIZ, ABRAHAM  [facility] Pager [phone]. If 8PM-8AM, please contact night-coverage at [email or web address], password [email or web address] 06/02/2013, 7:08 AM  LOS: 1 day

## 2013-06-02 NOTE — Progress Notes (Signed)
Utilization review completed.  

## 2013-06-03 ENCOUNTER — Inpatient Hospital Stay (HOSPITAL_COMMUNITY): Payer: Medicare Other

## 2013-06-03 DIAGNOSIS — J9600 Acute respiratory failure, unspecified whether with hypoxia or hypercapnia: Secondary | ICD-10-CM

## 2013-06-03 DIAGNOSIS — N179 Acute kidney failure, unspecified: Secondary | ICD-10-CM

## 2013-06-03 DIAGNOSIS — J9601 Acute respiratory failure with hypoxia: Secondary | ICD-10-CM | POA: Diagnosis present

## 2013-06-03 DIAGNOSIS — Z66 Do not resuscitate: Secondary | ICD-10-CM | POA: Diagnosis present

## 2013-06-03 DIAGNOSIS — I4891 Unspecified atrial fibrillation: Secondary | ICD-10-CM

## 2013-06-03 DIAGNOSIS — Z515 Encounter for palliative care: Secondary | ICD-10-CM

## 2013-06-03 DIAGNOSIS — J189 Pneumonia, unspecified organism: Principal | ICD-10-CM

## 2013-06-03 LAB — POCT I-STAT 3, ART BLOOD GAS (G3+)
Acid-base deficit: 2 mmol/L (ref 0.0–2.0)
BICARBONATE: 21.6 meq/L (ref 20.0–24.0)
O2 Saturation: 91 %
PCO2 ART: 31.1 mmHg — AB (ref 35.0–45.0)
TCO2: 23 mmol/L (ref 0–100)
pH, Arterial: 7.45 (ref 7.350–7.450)
pO2, Arterial: 58 mmHg — ABNORMAL LOW (ref 80.0–100.0)

## 2013-06-03 LAB — PROTIME-INR
INR: 2.8 — ABNORMAL HIGH (ref 0.00–1.49)
Prothrombin Time: 28.5 seconds — ABNORMAL HIGH (ref 11.6–15.2)

## 2013-06-03 LAB — PROCALCITONIN: PROCALCITONIN: 1.82 ng/mL

## 2013-06-03 LAB — BASIC METABOLIC PANEL
BUN: 26 mg/dL — ABNORMAL HIGH (ref 6–23)
CO2: 22 mEq/L (ref 19–32)
CREATININE: 1.41 mg/dL — AB (ref 0.50–1.35)
Calcium: 8.3 mg/dL — ABNORMAL LOW (ref 8.4–10.5)
Chloride: 96 mEq/L (ref 96–112)
GFR, EST AFRICAN AMERICAN: 53 mL/min — AB (ref >90)
GFR, EST NON AFRICAN AMERICAN: 46 mL/min — AB (ref >90)
Glucose, Bld: 122 mg/dL — ABNORMAL HIGH (ref 70–99)
Potassium: 3.7 mEq/L (ref 3.7–5.3)
SODIUM: 136 meq/L — AB (ref 137–147)

## 2013-06-03 LAB — PRO B NATRIURETIC PEPTIDE: Pro B Natriuretic peptide (BNP): 14087 pg/mL — ABNORMAL HIGH (ref 0–450)

## 2013-06-03 MED ORDER — FUROSEMIDE 10 MG/ML IJ SOLN: 40.0000 mg | Freq: Once | INTRAMUSCULAR | Status: DC

## 2013-06-03 MED ORDER — MORPHINE SULFATE 10 MG/ML IJ SOLN
4.0000 mg/h | INTRAMUSCULAR | Status: DC
  Administered 2013-06-03 (×2): 4 mg/h via INTRAVENOUS
  Filled 2013-06-03 (×2): qty 10

## 2013-06-03 MED ORDER — MORPHINE SULFATE 2 MG/ML IJ SOLN
2.0000 mg | INTRAMUSCULAR | Status: DC | PRN
  Administered 2013-06-03: 4 mg via INTRAVENOUS
  Administered 2013-06-03: 2 mg via INTRAVENOUS
  Administered 2013-06-03: 4 mg via INTRAVENOUS
  Filled 2013-06-03: qty 1
  Filled 2013-06-03 (×2): qty 2

## 2013-06-03 MED ORDER — METHYLPREDNISOLONE SODIUM SUCC 40 MG IJ SOLR
40.0000 mg | Freq: Two times a day (BID) | INTRAMUSCULAR | Status: DC
  Filled 2013-06-03 (×3): qty 1

## 2013-06-03 MED ORDER — WARFARIN - PHARMACIST DOSING INPATIENT: Freq: Every day | Status: DC

## 2013-06-03 MED ORDER — FUROSEMIDE 10 MG/ML IJ SOLN
20.0000 mg | Freq: Once | INTRAMUSCULAR | Status: AC
  Administered 2013-06-03: 20 mg via INTRAVENOUS

## 2013-06-03 MED ORDER — FUROSEMIDE 10 MG/ML IJ SOLN
20.0000 mg | Freq: Once | INTRAMUSCULAR | Status: AC
  Administered 2013-06-03: 20 mg via INTRAVENOUS
  Filled 2013-06-03: qty 2

## 2013-06-03 MED ORDER — WARFARIN SODIUM 6 MG PO TABS
6.0000 mg | ORAL_TABLET | Freq: Once | ORAL | Status: DC
  Filled 2013-06-03: qty 1

## 2013-06-03 NOTE — Progress Notes (Signed)
eLink Physician-Brief Progress Note Patient Name: Steven MooresCharles J Thompson DOB: Jun 18, 1932 MRN: [id number]  Date of Service  06/03/2013   HPI/Events of Note  Pt now DNR/DNI and resp distress.  Pt with ongoing increased work of breathing   eICU Interventions  Morphine dose increased and interval made more frequent   Intervention Category Major Interventions: Respiratory failure - evaluation and management;End of life / care limitation discussion  Shan Levansatrick Wright 06/03/2013, 3:59 PM

## 2013-06-03 NOTE — Progress Notes (Signed)
ANTICOAGULATION CONSULT NOTE - Follow Up Consult  Pharmacy Consult for Coumadin Indication: atrial fibrillation  No Known Allergies  Patient Measurements: Height: 6\' 1"  (185.4 cm) (Simultaneous filing. User may not have seen previous data.) Weight: 151 lb 14.4 oz (68.9 kg) IBW/kg (Calculated) : 79.9  Vital Signs: Temp: 97.5 F (36.4 C) (04/06 1156) Temp src: Oral (04/06 1156) BP: 101/69 mmHg (04/06 1156) Pulse Rate: 106 (04/06 1156)  Labs:  Recent Labs  05/31/2013 0117 06/19/2013 0118 06/15/2013 0130 06/02/13 0342 06/02/13 1523 06/03/13 0313  HGB 9.7*  --  9.2*  --   --   --   HCT 28.8*  --  27.0*  --   --   --   PLT 255  --   --   --   --   --   LABPROT 35.1*  --   --   --  46.8* 28.5*  INR 3.67*  --   --   --  5.21* 2.80*  CREATININE 1.63*  --  1.80* 1.33  --  1.41*  TROPONINI  --  <0.30  --   --   --   --     Estimated Creatinine Clearance: 41.4 ml/min (by C-G formula based on Cr of 1.41).  Assessment: 81yom on coumadin pta for afib, admitted with supratherapeutic INR. Received a dose of levaquin and azithromycin and INR jumped even further to 5.21. Vitamin K 2.5mg  po x 1 given yesterday. INR down to 2.8 today - will re-dose coumadin. May require higher doses initially to overcome vitamin k resistance. No bleeding reported.  Home dose: 5mg  daily  Goal of Therapy:  INR 2-3 Monitor platelets by anticoagulation protocol: Yes   Plan:  1) Coumadin 6mg  x 1 2) INR in AM  Fredrik RiggerMarkle, Clifford Coudriet Sue 06/03/2013,3:31 PM

## 2013-06-03 NOTE — Progress Notes (Addendum)
eLink Physician-Brief Progress Note Patient Name: Steven MooresCharles J Mcconnell DOB: March 19, 1932 MRN: [id number]  Date of Service  06/03/2013   HPI/Events of Note   PT with worsening resp distress  eICU Interventions  Focus on comfort and start morphine drip   Intervention Category Major Interventions: Respiratory failure - evaluation and management;End of life / care limitation discussion  Shan Levansatrick Ole Lafon 06/03/2013, 6:20 PM

## 2013-06-03 NOTE — Progress Notes (Signed)
[facility] PROGRESS NOTE Interim History: 80 y.o. male with Past medical history of dementia, A. fib, GERD, BPH.  The patient was brought in by his wife. Today the patient was at his baseline after dinner. Later on when they were trying to sleep the patient started complaining of shortness of breath. This was followed by gargling breathing sound. He also had cough with expectoration. The wife checked his oxygen saturation with her on pulse oximeter and he was found 55% on room air and she called EMS and they brought the patient to the hospital. He is on nonrebreather with 100% FiO2 and he is ABG is showing PO2 of 109, does his PaO2/FiO2 ratio is 109,   Assessment/Plan: Acute respiratory distress syndrome (ARDS) due to  CAP (community acquired pneumonia) - Cont  IV Vanc Azithro and rocephin started on 4.4.2015. - Still requiring High flow oxygen,sat >90 on non-breather. Afebrile. Check ABG. - Cultures negative till date. - Poor prognosis, use morphine low dose. Bipap RPN - Haldol PRN for agitation. - Consult pulmonary. CXR.  AKI: - Cr is improved to baseline - Bp stable.  Atrial fibrillation - Tachy due to overload. Give lasix and see how it improves. - resume diltiazem and use low dose metoprolol PRN.  Alzheimer's disease - cont home meds.  Hypernatremia: - Improved with IV fluids.  Code Status: DNR/DNI Family Communication: wofe  Disposition Plan: inpatinet   Consultants:  none  Procedures:  CXR  Antibiotics:  Vanc rocpehin azithro  HPI/Subjective: No complains relates his breathing is not any worst  Objective: Filed Vitals:   06/02/13 2021 06/03/13 0000 06/03/13 0400 06/03/13 0455  BP:  158/111 111/72   Pulse:  97    Temp: 99.4 F (37.4 C) 98.9 F (37.2 C) 99 F (37.2 C)   TempSrc: Axillary Oral Oral   Resp:  24    Height:      Weight:    68.9 kg (151 lb 14.4 oz)  SpO2:  82%      Intake/Output Summary (Last 24 hours) at 06/03/13  0720 Last data filed at 06/02/13 2300  Gross per 24 hour  Intake    903 ml  Output    575 ml  Net    328 ml   Filed Weights   02-Jun-2013 0500 06/02/13 0500 06/03/13 0455  Weight: 71.2 kg (156 lb 15.5 oz) 72.3 kg (159 lb 6.3 oz) 68.9 kg (151 lb 14.4 oz)    Exam:  General: Alert, awake, oriented x3, in no acute distress.  HEENT: No bruits, no goiter. - JVD Heart: Regular rate and rhythm, without murmurs, rubs, gallops.  Lungs: Good air movement, crackles on the left Abdomen: Soft, nontender, nondistended, positive bowel sounds.     Data Reviewed: Basic Metabolic Panel:  Recent Labs Lab June 02, 2013 0117 2013/06/02 0130 06/02/13 0342 06/03/13 0313  NA 138 136* 137 136*  K 4.2 3.9 4.0 3.7  CL 103 103 99 96  CO2 21  --  21 22  GLUCOSE 138* 137* 132* 122*  BUN 26* 23 20 26*  CREATININE 1.63* 1.80* 1.33 1.41*  CALCIUM 8.4  --  8.2* 8.3*   Liver Function Tests:  Recent Labs Lab Jun 02, 2013 0117  AST 11  ALT 9  ALKPHOS 82  BILITOT 0.3  PROT 6.5  ALBUMIN 2.6*   No results found for this basename: LIPASE, AMYLASE,  in the last 168 hours No results found for this basename: AMMONIA,  in the last 168 hours CBC:  Recent  Labs Lab 11-Jul-2013 0117 11-Jul-2013 0130  WBC 10.0  --   HGB 9.7* 9.2*  HCT 28.8* 27.0*  MCV 83.2  --   PLT 255  --    Cardiac Enzymes:  Recent Labs Lab 11-Jul-2013 0118  TROPONINI <0.30   BNP (last 3 results) No results found for this basename: PROBNP,  in the last 8760 hours CBG:  Recent Labs Lab 11-Jul-2013 0834  GLUCAP 140*    Recent Results (from the past 240 hour(s))  CULTURE, BLOOD (ROUTINE X 2)     Status: None   Collection Time    11-Jul-2013  4:20 AM      Result Value Ref Range Status   Specimen Description BLOOD RIGHT ARM   Final   Special Requests     Final   Value: BOTTLES DRAWN AEROBIC AND ANAEROBIC 10CC EACH,PT ON LEVAQUIN 750MG    Culture  Setup Time     Final   Value: 2014/02/17 12:44     [facility]    Culture     Final   Value:        BLOOD CULTURE RECEIVED NO GROWTH TO DATE CULTURE WILL BE HELD FOR 5 DAYS BEFORE ISSUING A FINAL NEGATIVE REPORT     [facility]   Report Status PENDING   Incomplete  CULTURE, BLOOD (ROUTINE X 2)     Status: None   Collection Time    11-Jul-2013  4:30 AM      Result Value Ref Range Status   Specimen Description BLOOD RIGHT HAND   Final   Special Requests     Final   Value: BOTTLES DRAWN AEROBIC ONLY 10CC,PT ON LEVAQUIN 750MG    Culture  Setup Time     Final   Value: 2014/02/17 12:44     Performed at [facility]   Culture     Final   Value:        BLOOD CULTURE RECEIVED NO GROWTH TO DATE CULTURE WILL BE HELD FOR 5 DAYS BEFORE ISSUING A FINAL NEGATIVE REPORT     Performed at [facility]   Report Status PENDING   Incomplete  MRSA PCR SCREENING     Status: None   Collection Time    11-Jul-2013  5:48 AM      Result Value Ref Range Status   MRSA by PCR NEGATIVE  NEGATIVE Final   Comment:            The GeneXpert MRSA Assay (FDA     approved for NASAL specimens     only), is one component of a     comprehensive MRSA colonization     surveillance program. It is not     intended to diagnose MRSA     infection nor to guide or     monitor treatment for     MRSA infections.     Studies: Dg Swallowing Func-speech Pathology  06/15/2013   Amy Cecille AverK Oleksiak, CCC-SLP     06/24/2013  1:29 PM Objective Swallowing Evaluation: Modified Barium Swallowing Study   Patient Details  Name: Steven MooresCharles J Shappell MRN: [id number] Date of Birth: 25-Mar-1932  Today's Date: 06/22/2013 Time: 1220-1300 SLP Time Calculation (min): 40 min  Past Medical History:  Past Medical History  Diagnosis Date  . Memory loss   . Atrial fibrillation   . Diverticulosis   . Gastroesophageal reflux   . Hyperlipidemia   . Benign prostatic hypertrophy   . Herpes zoster   .  Chronic kidney disease   . History of orchiectomy, unilateral    Past Surgical History:  Past Surgical History   Procedure Laterality Date  . Hernia repair    . Orchiectomy     HPI:  80 y.o. male with Past medical history of dementia, A. fib, GERD,  BPH.       Assessment / Plan / Recommendation Clinical Impression  Dysphagia Diagnosis: Within Functional Limits Clinical impression: Pt demonstrated a timely swallow with no  penetration/ aspiration. Pt struggled with pill and was unable to  move it posteriorly in mouth. Rx that meds be crushed with puree.  Also rx regular diet w/ thin liquids; given hx of GERD pt should  be seated upright during meals and for at least 30 minutes after.  Speech will sign off at this time; please re-consult if needed.    Treatment Recommendation  No treatment recommended at this time    Diet Recommendation Regular;Thin liquid   Liquid Administration via: Cup;Straw Medication Administration: Crushed with puree Supervision: Patient able to self feed Postural Changes and/or Swallow Maneuvers: Seated upright 90  degrees;Upright 30-60 min after meal    Other  Recommendations Oral Care Recommendations: Oral care BID   Follow Up Recommendations  None    Frequency and Duration        Pertinent Vitals/Pain N/A    SLP Swallow Goals     General HPI: 80 y.o. male with Past medical history of dementia,  A. fib, GERD, BPH.   Type of Study: Modified Barium Swallowing Study Reason for Referral: Objectively evaluate swallowing function Previous Swallow Assessment: BSE this morning Diet Prior to this Study: NPO Temperature Spikes Noted: Yes Respiratory Status: venti-mask History of Recent Intubation: No Behavior/Cognition: Alert;Cooperative;Pleasant mood Oral Cavity - Dentition: Adequate natural dentition Oral Motor / Sensory Function: Within functional limits Self-Feeding Abilities: Able to feed self Patient Positioning: Upright in chair Baseline Vocal Quality: Clear Volitional Cough: Strong Volitional Swallow: Able to elicit Anatomy: Within functional limits Pharyngeal Secretions: Not observed secondary MBS     Reason for Referral Objectively evaluate swallowing function   Oral Phase Oral Preparation/Oral Phase Oral Phase: Impaired Oral - Thin Oral - Thin Cup: Within functional limits Oral - Thin Straw: Within functional limits Oral - Solids Oral - Puree: Within functional limits Oral - Regular: Within functional limits Oral - Pill: Holding of bolus Oral Phase - Comment Oral Phase - Comment: Could not move pill posteriorly in mouth   Pharyngeal Phase Pharyngeal Phase Pharyngeal Phase: Within functional limits  Cervical Esophageal Phase    GO    Cervical Esophageal Phase Cervical Esophageal Phase: Karlyn Agee, Amy K, CCC-SLP 06/05/13, 1:28 PM     Scheduled Meds: . azithromycin  500 mg Intravenous Q24H  . cefTRIAXone (ROCEPHIN)  IV  1 g Intravenous Q24H  . diltiazem  120 mg Oral Daily  . ramelteon  8 mg Oral QHS  . sodium chloride  3 mL Intravenous Q12H  . vancomycin  1,000 mg Intravenous Q24H   Continuous Infusions:     Marinda Elk  [facility] Pager [phone]. If 8PM-8AM, please contact night-coverage at [email or web address], password [facility] 06/03/2013, 7:20 AM  LOS: 2 days

## 2013-06-03 NOTE — Consult Note (Signed)
PULMONARY / CRITICAL CARE MEDICINE  Name: Steven Thompson MRN: [id number] DOB: 1932-03-20    ADMISSION DATE:  06/02/2013 CONSULTATION DATE:  06/03/13  REFERRING MD :  David StallFeliz-Ortiz  PRIMARY SERVICE:  TRH  CHIEF COMPLAINT:  Respiratory failure   BRIEF PATIENT DESCRIPTION: 80 yo male with AF on Coumadin and dementia admitted 4/4 with CAP/ acute respiratory failure.  Developed worsening hypoxia, increased work of breathing 4/6 and PCCM consulted.  Patient is DNR/DNI.   SIGNIFICANT EVENTS / STUDIES:   LINES / TUBES:  CULTURES: 4/6  Blood >>> 4/4  Flu PCR >>> neg   ANTIBIOTICS: Rocephin 4/4 >>> Azithromycin 4/4 >>> Vancomycin 4/4 >>>  HISTORY OF PRESENT ILLNESS:  80 yo male with hx Afib, Dementia.  Independent with ADLs at baseline. He presented 4/4 with acute onset SOB, productive cough, hypoxia.  Currently denies chest pain, hemoptysis, BLE swelling, orthopnea, leg/calf pain, n/v/d, fevers, chills which wife also denied at time of admit.  Now in SDU on NRB.  Feeling "ok" and "just ready to go home".   PAST MEDICAL HISTORY :  Past Medical History  Diagnosis Date  . Memory loss   . Atrial fibrillation   . Diverticulosis   . Gastroesophageal reflux   . Hyperlipidemia   . Benign prostatic hypertrophy   . Herpes zoster   . Chronic kidney disease   . History of orchiectomy, unilateral    Past Surgical History  Procedure Laterality Date  . Hernia repair    . Orchiectomy     Prior to Admission medications   Medication Sig Start Date End Date Taking? Authorizing Provider  amLODipine (NORVASC) 10 MG tablet TAKE 1 TABLET BY MOUTH EVERY DAY FOR BLOOD PRESSURE 02/06/13  Yes Corky CraftsJayadeep S Varanasi, MD  amLODipine (NORVASC) 10 MG tablet Take 10 mg by mouth daily.   Yes Historical Provider, MD  diltiazem (CARDIZEM CD) 120 MG 24 hr capsule TAKE 1 TABLET EVERY DAY BEFORE A MEAL 02/05/13  Yes Corky CraftsJayadeep S Varanasi, MD  finasteride (PROSCAR) 5 MG tablet Take 5 mg by mouth daily.   Yes Historical  Provider, MD  flecainide (TAMBOCOR) 100 MG tablet TAKE 1 TABLET TWICE A DAY 02/27/13  Yes Corky CraftsJayadeep S Varanasi, MD  Methylfol-Methylcob-Acetylcyst (CEREFOLIN NAC PO) Take 1 capsule by mouth daily.   Yes Historical Provider, MD  rosuvastatin (CRESTOR) 10 MG tablet Take 10 mg by mouth daily.   Yes Historical Provider, MD  warfarin (COUMADIN) 5 MG tablet Take 5 mg by mouth daily.   Yes Historical Provider, MD   No Known Allergies  FAMILY HISTORY:  Family History  Problem Relation Age of Onset  . Stroke Mother   . Heart attack Father    SOCIAL HISTORY:  reports that he has never smoked. He does not have any smokeless tobacco history on file. He reports that he does not drink alcohol or use illicit drugs.  REVIEW OF SYSTEMS:   As per HPI - All other systems reviewed and were neg.   VITAL SIGNS: Temp:  [98.1 F (36.7 C)-99.4 F (37.4 C)] 98.1 F (36.7 C) (04/06 0754) Pulse Rate:  [32-140] 115 (04/06 0754) Resp:  [7-37] 26 (04/06 0754) BP: (111-159)/(63-111) 117/72 mmHg (04/06 0754) SpO2:  [36 %-96 %] 96 % (04/06 0754) Weight:  [151 lb 14.4 oz (68.9 kg)] 151 lb 14.4 oz (68.9 kg) (04/06 0455)  PHYSICAL EXAMINATION: General:   Thin male, NAD on NRB  Neuro:  Awake, alert, appropriate, oriented x 2, MAE HEENT:  Mm dry, NRB,  no JVD  Cardiovascular:  s1s2 rrr, mild tachy  Lungs:  resps even, shallow, non labored on NRB, few scattered crackles  Abdomen:  Soft, +bs Musculoskeletal:  Warm and dry, no edema    Recent Labs Lab Jun 06, 2013 0117 2013/06/06 0130 06/02/13 0342 06/03/13 0313  NA 138 136* 137 136*  K 4.2 3.9 4.0 3.7  CL 103 103 99 96  CO2 21  --  21 22  BUN 26* 23 20 26*  CREATININE 1.63* 1.80* 1.33 1.41*  GLUCOSE 138* 137* 132* 122*    Recent Labs Lab 06-06-13 0117 2013-06-06 0130  HGB 9.7* 9.2*  HCT 28.8* 27.0*  WBC 10.0  --   PLT 255  --    Dg Chest Port 1 View  06/03/2013   CLINICAL DATA:  Hypoxia.  EXAM: PORTABLE CHEST - 1 VIEW  COMPARISON:  06-06-13.   FINDINGS: Progressive diffuse airspace disease with change most notable involving the left upper lobe. This may represent asymmetric pulmonary edema versus infectious infiltrate  No gross pneumothorax.  Heart size top-normal slightly enlarged.  Calcified aorta.  IMPRESSION: Diffuse airspace disease with worsening most notable involving the left upper lobe. Question pulmonary edema versus infectious infiltrate.   Electronically Signed   By: Bridgett Larsson M.D.   On: 06/03/2013 08:06   Dg Swallowing Func-speech Pathology  June 06, 2013   Amy Cecille Aver, CCC-SLP     06-06-2013  1:29 PM Objective Swallowing Evaluation: Modified Barium Swallowing Study   Patient Details  Name: Steven Thompson MRN: [id number] Date of Birth: 10/01/1932  Today's Date: 06/06/13 Time: 1220-1300 SLP Time Calculation (min): 40 min  Past Medical History:  Past Medical History  Diagnosis Date  . Memory loss   . Atrial fibrillation   . Diverticulosis   . Gastroesophageal reflux   . Hyperlipidemia   . Benign prostatic hypertrophy   . Herpes zoster   . Chronic kidney disease   . History of orchiectomy, unilateral    Past Surgical History:  Past Surgical History  Procedure Laterality Date  . Hernia repair    . Orchiectomy     HPI:  81 y.o. male with Past medical history of dementia, A. fib, GERD,  BPH.       Assessment / Plan / Recommendation Clinical Impression  Dysphagia Diagnosis: Within Functional Limits Clinical impression: Pt demonstrated a timely swallow with no  penetration/ aspiration. Pt struggled with pill and was unable to  move it posteriorly in mouth. Rx that meds be crushed with puree.  Also rx regular diet w/ thin liquids; given hx of GERD pt should  be seated upright during meals and for at least 30 minutes after.  Speech will sign off at this time; please re-consult if needed.    Treatment Recommendation  No treatment recommended at this time    Diet Recommendation Regular;Thin liquid   Liquid Administration via: Cup;Straw Medication  Administration: Crushed with puree Supervision: Patient able to self feed Postural Changes and/or Swallow Maneuvers: Seated upright 90  degrees;Upright 30-60 min after meal    Other  Recommendations Oral Care Recommendations: Oral care BID   Follow Up Recommendations  None    Frequency and Duration        Pertinent Vitals/Pain N/A    SLP Swallow Goals     General HPI: 80 y.o. male with Past medical history of dementia,  A. fib, GERD, BPH.   Type of Study: Modified Barium Swallowing Study Reason for Referral: Objectively evaluate swallowing function Previous Swallow Assessment:  BSE this morning Diet Prior to this Study: NPO Temperature Spikes Noted: Yes Respiratory Status: venti-mask History of Recent Intubation: No Behavior/Cognition: Alert;Cooperative;Pleasant mood Oral Cavity - Dentition: Adequate natural dentition Oral Motor / Sensory Function: Within functional limits Self-Feeding Abilities: Able to feed self Patient Positioning: Upright in chair Baseline Vocal Quality: Clear Volitional Cough: Strong Volitional Swallow: Able to elicit Anatomy: Within functional limits Pharyngeal Secretions: Not observed secondary MBS    Reason for Referral Objectively evaluate swallowing function   Oral Phase Oral Preparation/Oral Phase Oral Phase: Impaired Oral - Thin Oral - Thin Cup: Within functional limits Oral - Thin Straw: Within functional limits Oral - Solids Oral - Puree: Within functional limits Oral - Regular: Within functional limits Oral - Pill: Holding of bolus Oral Phase - Comment Oral Phase - Comment: Could not move pill posteriorly in mouth   Pharyngeal Phase Pharyngeal Phase Pharyngeal Phase: Within functional limits  Cervical Esophageal Phase    GO    Cervical Esophageal Phase Cervical Esophageal Phase: Karlyn Agee, Amy K, CCC-SLP 06/15/2013, 1:28 PM     ASSESSMENT / PLAN:  Acute respiratory failure CAP Possibly pulmonary edema  Supplemental O2 as needed, wean for O2 sats >92%   Trend CXR   No  further ABGs - DNI  PRN morphine for increased WOB   Cont abx as above for presumed CAP   Check PCT  Continue Lasix as BP allows ( 20mg  given at 0900 ), aim for negative balance  Check BNP   Consider 2D echo   Follow sputum culture  Consider d/c Vancomycin  Albuterol PRN  Atrial fibrillation  Cardizem scheduled  Metoprolol PRN  Coumadin on hold   AKI, improving  Follow BMET  WHITEHEART,KATHRYN, NP 06/03/2013  9:09 AM Pager: [phone] or [phone]  PCCM will sign off.  Please re consult as needed.  I have personally obtained history, examined patient, evaluated and interpreted laboratory and imaging results, reviewed medical records, formulated assessment / plan and placed orders.  Lonia Farber, MD Pulmonary and Critical Care Medicine [facility] Pager: [phone]  06/03/2013, 1:59 PM

## 2013-06-04 LAB — BASIC METABOLIC PANEL
BUN: 37 mg/dL — ABNORMAL HIGH (ref 6–23)
CO2: 24 mEq/L (ref 19–32)
CREATININE: 1.72 mg/dL — AB (ref 0.50–1.35)
Calcium: 8.1 mg/dL — ABNORMAL LOW (ref 8.4–10.5)
Chloride: 94 mEq/L — ABNORMAL LOW (ref 96–112)
GFR, EST AFRICAN AMERICAN: 42 mL/min — AB (ref >90)
GFR, EST NON AFRICAN AMERICAN: 36 mL/min — AB (ref >90)
Glucose, Bld: 109 mg/dL — ABNORMAL HIGH (ref 70–99)
POTASSIUM: 4 meq/L (ref 3.7–5.3)
Sodium: 134 mEq/L — ABNORMAL LOW (ref 137–147)

## 2013-06-04 LAB — PROTIME-INR
INR: 2.34 — ABNORMAL HIGH (ref 0.00–1.49)
Prothrombin Time: 24.9 seconds — ABNORMAL HIGH (ref 11.6–15.2)

## 2013-06-04 LAB — PROCALCITONIN: Procalcitonin: 2.7 ng/mL

## 2013-06-04 MED ORDER — PIPERACILLIN-TAZOBACTAM 3.375 G IVPB
3.3750 g | Freq: Three times a day (TID) | INTRAVENOUS | Status: DC
  Filled 2013-06-04 (×2): qty 50

## 2013-06-04 MED ORDER — ATROPINE SULFATE 1 % OP SOLN
4.0000 [drp] | Freq: Four times a day (QID) | OPHTHALMIC | Status: DC
  Administered 2013-06-04: 4 [drp] via SUBLINGUAL
  Filled 2013-06-04: qty 2

## 2013-06-04 MED ORDER — DIPHENOXYLATE-ATROPINE 2.5-0.025 MG/5ML PO LIQD: 5.0000 mL | Freq: Four times a day (QID) | ORAL | Status: DC | PRN

## 2013-06-07 LAB — CULTURE, BLOOD (ROUTINE X 2)
Culture: NO GROWTH
Culture: NO GROWTH

## 2013-06-12 DIAGNOSIS — E871 Hypo-osmolality and hyponatremia: Secondary | ICD-10-CM | POA: Diagnosis present

## 2013-06-28 NOTE — Progress Notes (Signed)
Nutrition Brief Note  RD following pt due to NPO status and h/o unintentional weight loss.   Pt with worsening respiratory status; now transitioning to comfort care.  Pt currently has no diet order, however still requiring oxygen support and is obtunded. No further nutrition interventions warranted at this time. Consider diet for comfort if pt becomes appropriate. RD signing off. Please re-consult as needed.   Loyce DysKacie Tayelor Osborne, MS RD LDN Clinical Inpatient Dietitian Pager: [phone] Weekend/After hours pager: [phone]

## 2013-06-28 NOTE — Progress Notes (Signed)
[facility] PROGRESS NOTE Interim History: 80 y.o. male with Past medical history of dementia, A. fib, GERD, BPH.  The patient was brought in by his wife. Today the patient was at his baseline after dinner. Later on when they were trying to sleep the patient started complaining of shortness of breath. This was followed by gargling breathing sound. He also had cough with expectoration. The wife checked his oxygen saturation with her on pulse oximeter and he was found 55% on room air and she called EMS and they brought the patient to the hospital. He is on nonrebreather with 100% FiO2 and he is ABG is showing PO2 of 109, does his PaO2/FiO2 ratio is 109,   Assessment/Plan: Acute respiratory distress syndrome (ARDS) due to  CAP (community acquired pneumonia) - On  IV Vanc Azithro and rocephin started on 4.4.2015. - with overwhelming infection, now spiking fevers despite IV antibiotics. - Still requiring High flow oxygen,sat >90 on non-breather.  - Cultures negative till date. - Poor prognosis, use morphine low dose. Bipap RPN - Spoke with family they will like to move toward comfort care. - morphine drip.  AKI: - worsen due to diuresis.  Atrial fibrillation - family will like to move towards comfort care.  Alzheimer's disease - cont home meds.   Code Status: DNR/DNI Family Communication: wofe  Disposition Plan: inpatinet   Consultants:  none  Procedures:  CXR  Antibiotics:  Vanc rocpehin azithro  HPI/Subjective: lethargic  Objective: Filed Vitals:   06/03/13 2000 05/29/2013 0000 06/17/2013 0400 06/09/2013 0610  BP: 102/56 92/48 72/47  64/40  Pulse: 122 104 106 98  Temp: 98.8 F (37.1 C) 98.8 F (37.1 C) 101 F (38.3 C)   TempSrc: Oral Oral Oral   Resp: 30 10 9 7   Height:      Weight:   72.5 kg (159 lb 13.3 oz)   SpO2: 79% 82% 69% 77%    Intake/Output Summary (Last 24 hours) at 06/19/2013 0748 Last data filed at 06/20/2013 0510  Gross per 24 hour  Intake  841.27 ml  Output    650 ml  Net 191.27 ml   Filed Weights   06/02/13 0500 06/03/13 0455 06/03/2013 0400  Weight: 72.3 kg (159 lb 6.3 oz) 68.9 kg (151 lb 14.4 oz) 72.5 kg (159 lb 13.3 oz)    Exam:  General: lethargic  HEENT: No bruits, no goiter. - JVD Heart: Regular rate and rhythm, without murmurs, rubs, gallops.  Lungs: Good air movement, crackles on the left & right    Data Reviewed: Basic Metabolic Panel:  Recent Labs Lab 2013-04-29 0117 2013-04-29 0130 06/02/13 0342 06/03/13 0313 06/19/2013 0356  NA 138 136* 137 136* 134*  K 4.2 3.9 4.0 3.7 4.0  CL 103 103 99 96 94*  CO2 21  --  21 22 24   GLUCOSE 138* 137* 132* 122* 109*  BUN 26* 23 20 26* 37*  CREATININE 1.63* 1.80* 1.33 1.41* 1.72*  CALCIUM 8.4  --  8.2* 8.3* 8.1*   Liver Function Tests:  Recent Labs Lab 2013-04-29 0117  AST 11  ALT 9  ALKPHOS 82  BILITOT 0.3  PROT 6.5  ALBUMIN 2.6*   No results found for this basename: LIPASE, AMYLASE,  in the last 168 hours No results found for this basename: AMMONIA,  in the last 168 hours CBC:  Recent Labs Lab 2013-04-29 0117 2013-04-29 0130  WBC 10.0  --   HGB 9.7* 9.2*  HCT 28.8* 27.0*  MCV 83.2  --  PLT 255  --    Cardiac Enzymes:  Recent Labs Lab 05/31/2013 0118  TROPONINI <0.30   BNP (last 3 results)  Recent Labs  06/03/13 0313  PROBNP 14087.0*   CBG:  Recent Labs Lab 06/21/2013 0834  GLUCAP 140*    Recent Results (from the past 240 hour(s))  CULTURE, BLOOD (ROUTINE X 2)     Status: None   Collection Time    05/29/2013  4:20 AM      Result Value Ref Range Status   Specimen Description BLOOD RIGHT ARM   Final   Special Requests     Final   Value: BOTTLES DRAWN AEROBIC AND ANAEROBIC 10CC EACH,PT ON LEVAQUIN 750MG    Culture  Setup Time     Final   Value: 06/02/2013 12:44     Performed at [facility]   Culture     Final   Value:        BLOOD CULTURE RECEIVED NO GROWTH TO DATE CULTURE WILL BE HELD FOR 5 DAYS BEFORE ISSUING A FINAL  NEGATIVE REPORT     Performed at [facility]   Report Status PENDING   Incomplete  CULTURE, BLOOD (ROUTINE X 2)     Status: None   Collection Time    05/31/2013  4:30 AM      Result Value Ref Range Status   Specimen Description BLOOD RIGHT HAND   Final   Special Requests     Final   Value: BOTTLES DRAWN AEROBIC ONLY 10CC,PT ON LEVAQUIN 750MG    Culture  Setup Time     Final   Value: 06/15/2013 12:44     Performed at [facility]   Culture     Final   Value:        BLOOD CULTURE RECEIVED NO GROWTH TO DATE CULTURE WILL BE HELD FOR 5 DAYS BEFORE ISSUING A FINAL NEGATIVE REPORT     Performed at [facility]   Report Status PENDING   Incomplete  MRSA PCR SCREENING     Status: None   Collection Time    06/12/2013  5:48 AM      Result Value Ref Range Status   MRSA by PCR NEGATIVE  NEGATIVE Final   Comment:            The GeneXpert MRSA Assay (FDA     approved for NASAL specimens     only), is one component of a     comprehensive MRSA colonization     surveillance program. It is not     intended to diagnose MRSA     infection nor to guide or     monitor treatment for     MRSA infections.     Studies: Dg Chest Port 1 View  06/03/2013   CLINICAL DATA:  Hypoxia.  EXAM: PORTABLE CHEST - 1 VIEW  COMPARISON:  06/12/2013.  FINDINGS: Progressive diffuse airspace disease with change most notable involving the left upper lobe. This may represent asymmetric pulmonary edema versus infectious infiltrate  No gross pneumothorax.  Heart size top-normal slightly enlarged.  Calcified aorta.  IMPRESSION: Diffuse airspace disease with worsening most notable involving the left upper lobe. Question pulmonary edema versus infectious infiltrate.   Electronically Signed   By: Bridgett Larsson M.D.   On: 06/03/2013 08:06    Scheduled Meds: . vancomycin  1,000 mg Intravenous Q24H   Continuous Infusions: . morphine 4 mg/hr (06/03/13 2019)     FELIZ ORTIZ, Blessin Kanno  [facility] Pager  [phone]. If 8PM-8AM, please contact night-coverage at [email or web address], password [facility] 06-06-2013, 7:48 AM  LOS: 3 days

## 2013-06-28 NOTE — Progress Notes (Signed)
Pt expired at 1430 verified by De Nursey Acy Orsak, RN and Modena Nunneryamieko Hubbard, Charity fundraiserN.  MD David StallFeliz-Ortiz was paged and notified. Awaiting death certificate completion. Pt's daughter Cottie BandaLisa Draper was called and notified. She gave me the name of the funeral home to call and stated she wanted her father cremated. [place] [facility] was called and the patient was not a candidate. Will clean patient and transport to the morgue.

## 2013-06-28 NOTE — Discharge Summary (Addendum)
Death Summary  Steven MooresCharles J Lardizabal WJX:[id number] DOB: 1932/11/24 DOA: 06/07/2013  PCP: Thora LanceEHINGER,ROBERT R, MD PCP/Office notified: no  Admit date: 05/31/2013 Date of Death: 06/12/2013  Final Diagnoses:  Principal Problem:   Acute respiratory distress syndrome (ARDS) Active Problems:   Alzheimer's disease   Atrial fibrillation   Gastroesophageal reflux   Benign prostatic hypertrophy   CAP (community acquired pneumonia)   AKI (acute kidney injury)   Acute respiratory failure with hypoxia   DNR (do not resuscitate)   Comfort measures only status   Hyponatremia   History of present illness:  80 y.o. male with Past medical history of dementia, A. fib, GERD, BPH.  The patient was brought in by his wife. Today the patient was at his baseline after dinner. Later on when they were trying to sleep the patient started complaining of shortness of breath. This was followed by gargling breathing sound. He also had cough with expectoration. The wife checked his oxygen saturation with her on pulse oximeter and he was found 55% on room air and she called EMS and they brought the patient to the hospital.   Hospital Course:  Acute respiratory failure due to Acute respiratory distress syndrome (ARDS) due to CAP (community acquired pneumonia)  - On admission started on  IV Vanc Azithro and rocephin started on 4.4.2015.  - despite a IV antibiotics pt cont to spike fevers. Was still requiring High flow oxygen,sat >90 on non-breather. Pt was DNI. - the poor prognosis was explained to the family. - Spoke with family they will like to move toward comfort care.  - morphine drip started, pt passed on 4.7.2015.  AKI:  - attempt to diurese with worsen renal function.  Severe protein malnutrition.    Time: 90 minutes  Signed:  Marinda ElkAbraham Feliz Ortiz  [facility] 06/12/2013, 5:42 PM

## 2013-06-28 NOTE — Progress Notes (Signed)
Pt arrived to 6E05 via bed with family at the bedside. Pt obtunded with non rebreather mask on. Morphine drip at 4cc/hr. Comfort care cart brought to the room for the family. Family were introduced to the unit and staff. Will cont to monitor.

## 2013-06-28 DEATH — deceased

## 2014-03-27 IMAGING — US US ABDOMEN COMPLETE
1 series · 13 of 25 positions shown · non-contrast
Comparison: none

[Series 1: us abdomen complete · 0.24mm/px · 13 of 74 slices shown]
[im 1/74]
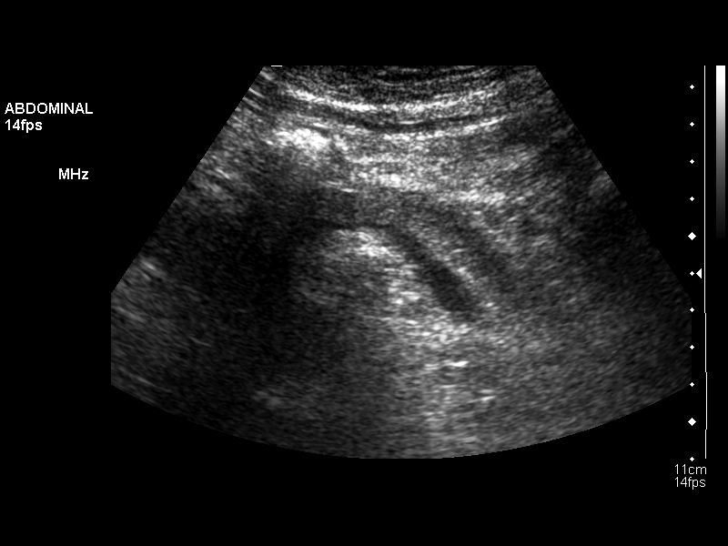
[im 7/74]
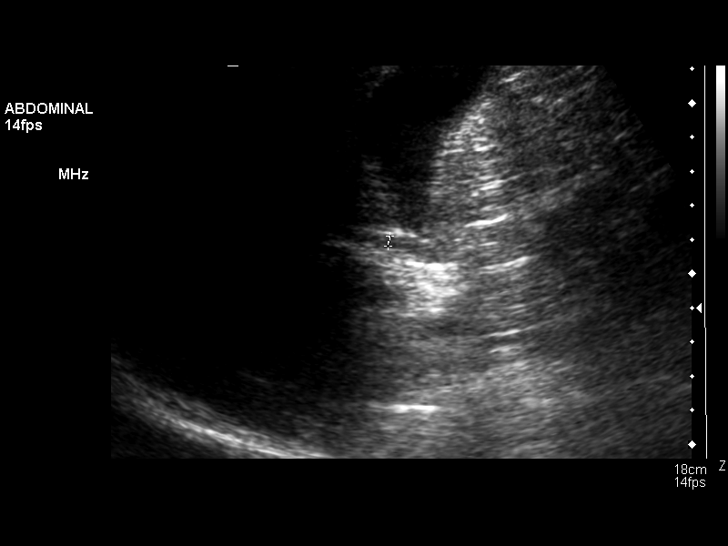
[im 13/74]
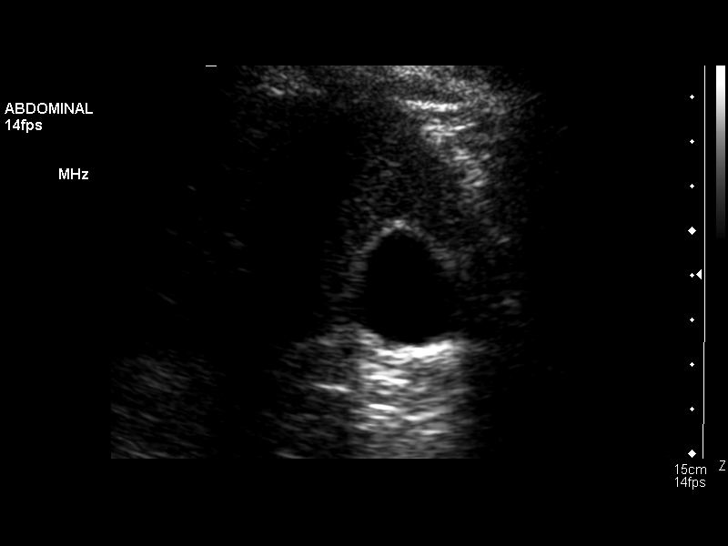
[im 19/74]
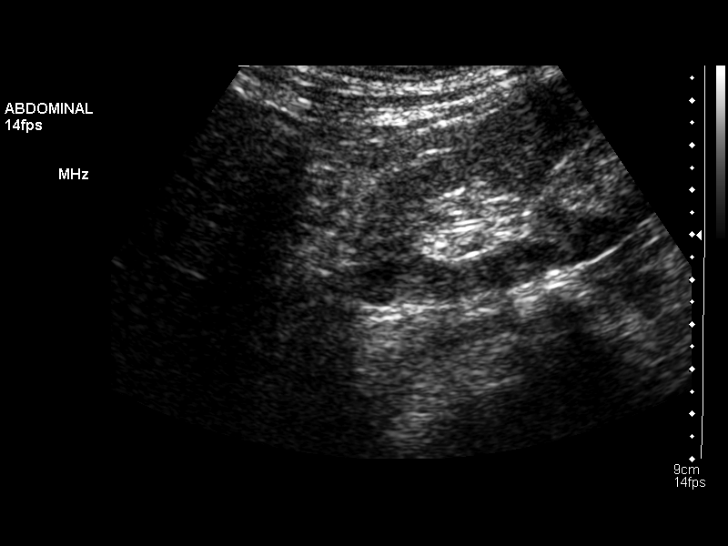
[im 25/74]
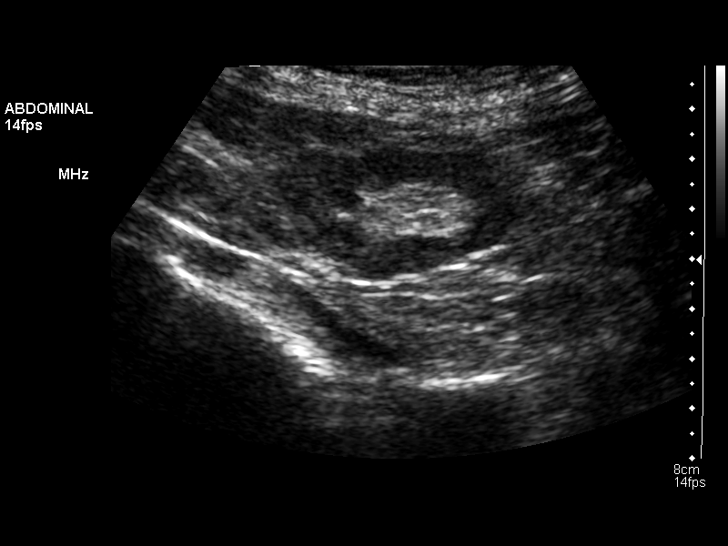
[im 31/74]
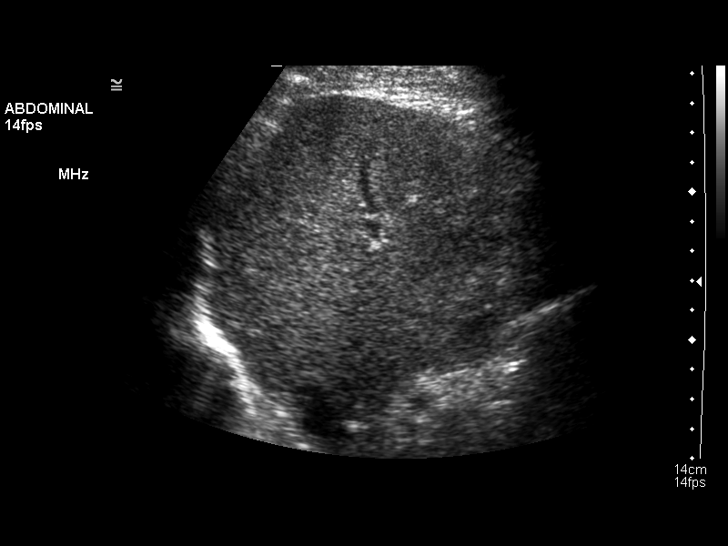
[im 37/74]
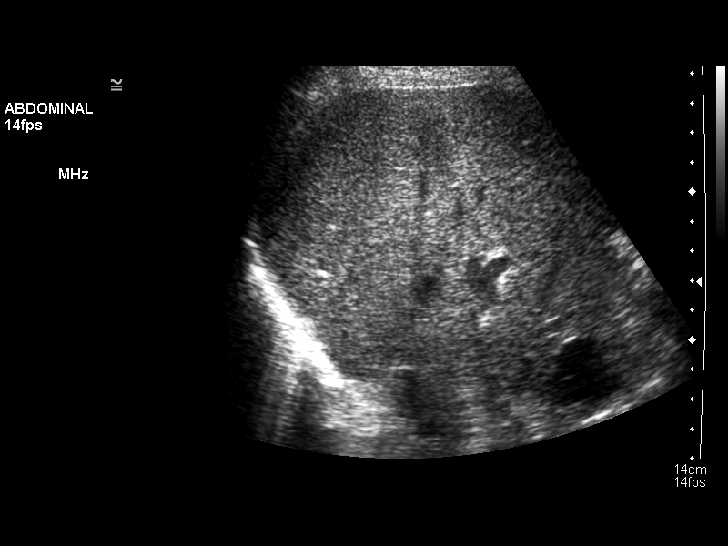
[im 43/74]
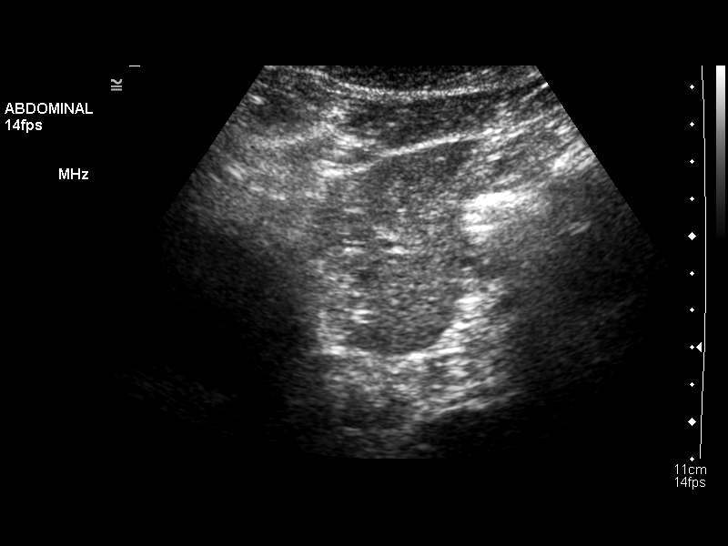
[im 49/74]
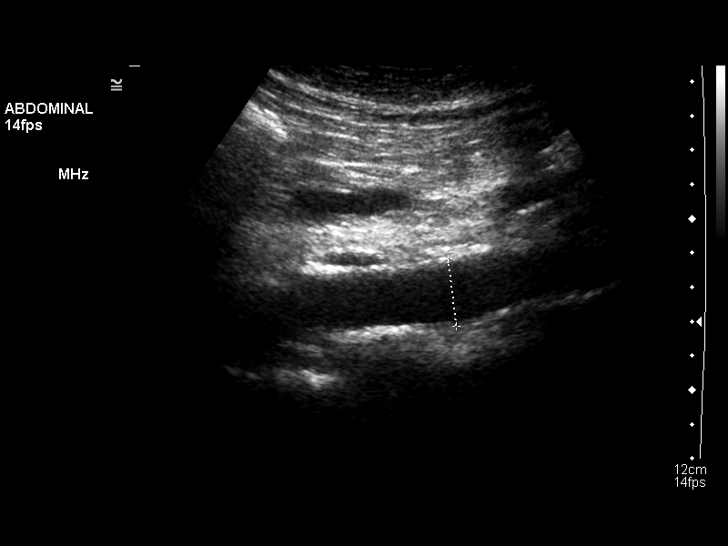
[im 55/74]
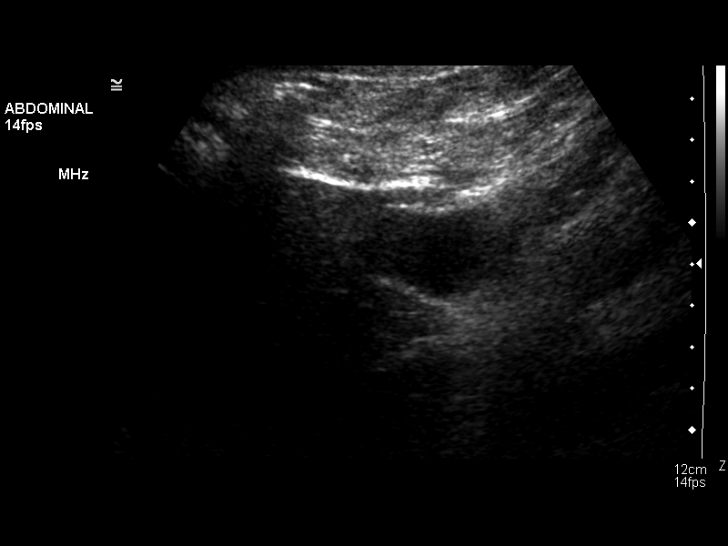
[im 61/74]
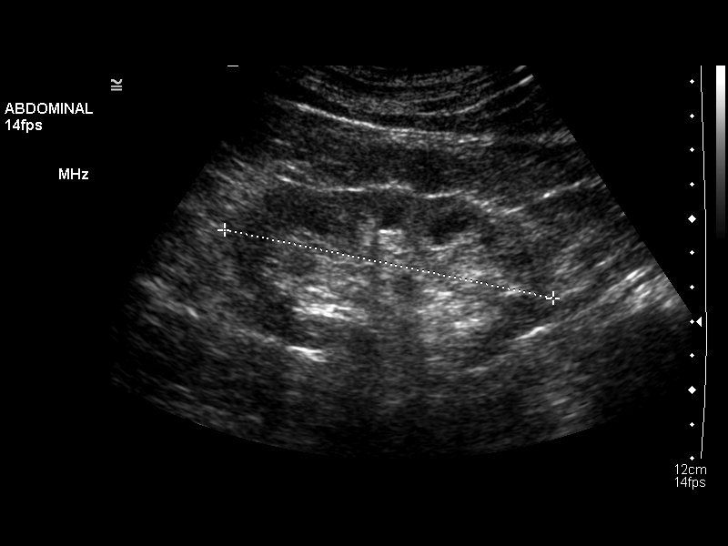
[im 67/74]
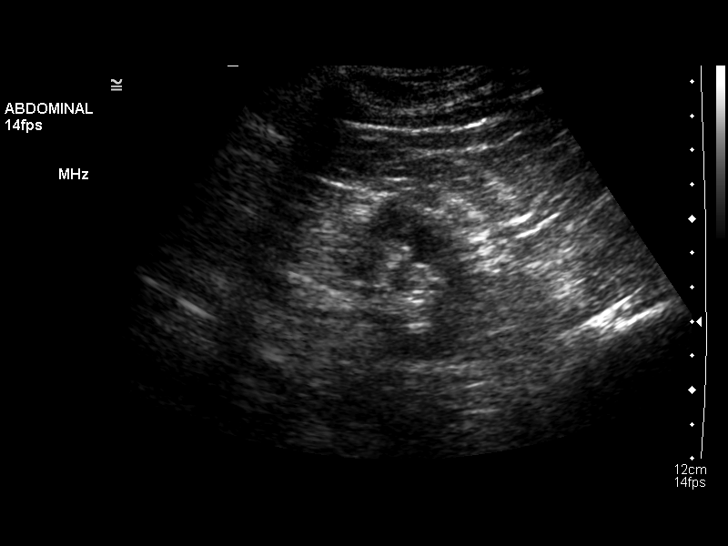
[im 74/74]
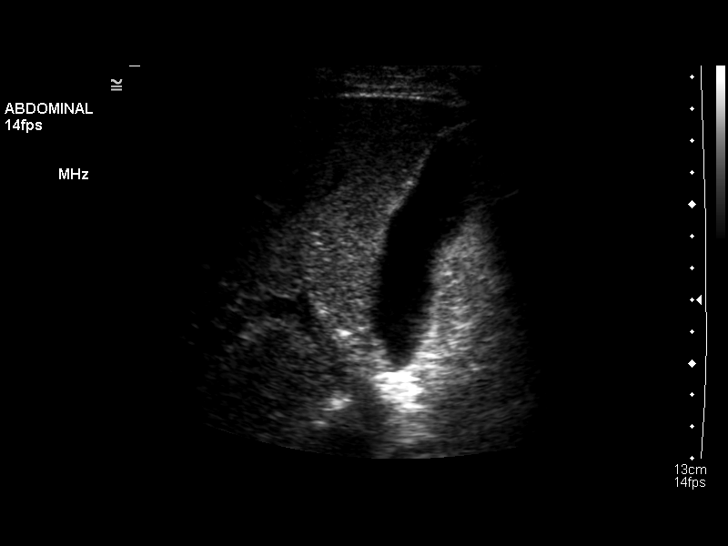

[13 of 25 positions shown; findings below may reference images not displayed]

CLINICAL DATA
Weight loss, fatigue, elevated sed rate

EXAM
ULTRASOUND ABDOMEN COMPLETE

COMPARISON
None.

FINDINGS
Gallbladder:

The gallbladder is visualized and no gallstones are noted. There is
no pain over the gallbladder with compression.

Common bile duct:

Diameter: The common bile duct is normal measuring 3 mm in diameter.

Liver:

The liver has a normal echogenic pattern. No focal abnormality is
seen.

IVC:

The IVC is partially obscured by bowel gas.

Pancreas:

The tail of the pancreas is obscured by bowel gas.

Spleen:

The spleen measures 2.8 cm sagittally.

Right Kidney:

Length: 9.6 cm.. No hydronephrosis is seen. The echogenicity of the
kidneys is somewhat increased, and correlation with renal laboratory
values is recommended.

Left Kidney:

Length: 9.8 cm.. No hydronephrosis is noted. The echogenicity also
appears somewhat increased and correlation with renal laboratory
values is recommended.

Abdominal aorta:

The abdominal aorta is normal caliber.

Other findings:

None.

IMPRESSION
1. No gallstones.  No ductal dilatation.
2. Slightly echogenic renal parenchyma. Recommend correlation with
renal laboratory values
3. The tail of the pancreas is obscured by bowel gas.

SIGNATURE

## 2014-04-16 IMAGING — CT CT ABD-PELV W/O CM
2 of 4 series · 17 of 46 positions shown, 19 images · IV contrast (READICAT/WATER)
Comparison: None.

CLINICAL DATA: Weight loss, elevated sed rate

EXAM:
CT ABDOMEN AND PELVIS WITHOUT CONTRAST
TECHNIQUE: Multidetector CT imaging of the abdomen and pelvis was performed
following the standard protocol without intravenous contrast.

[Series 2: abd/pelvis without · axial · non-contrast · 0.66mm/px · z∈[-479,-34]mm · 14 of 99 slices shown, 16 images]
[im 5/99  soft-tissue]
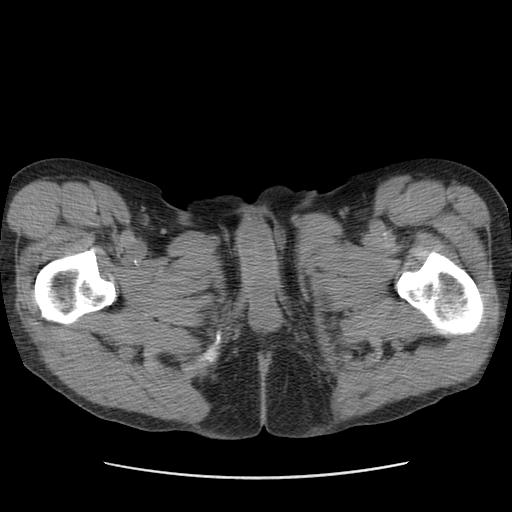
[im 5/99  bone]
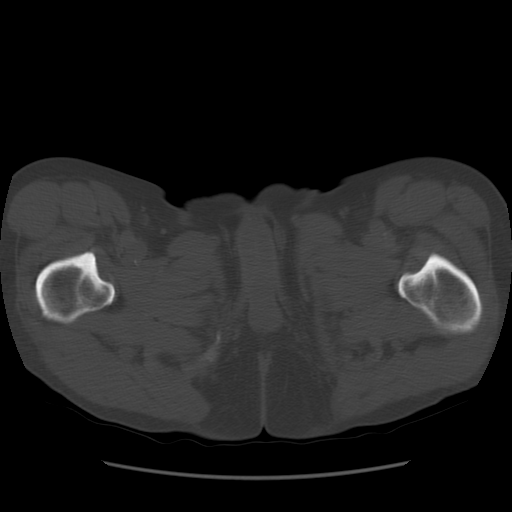
[im 13/99  soft-tissue]
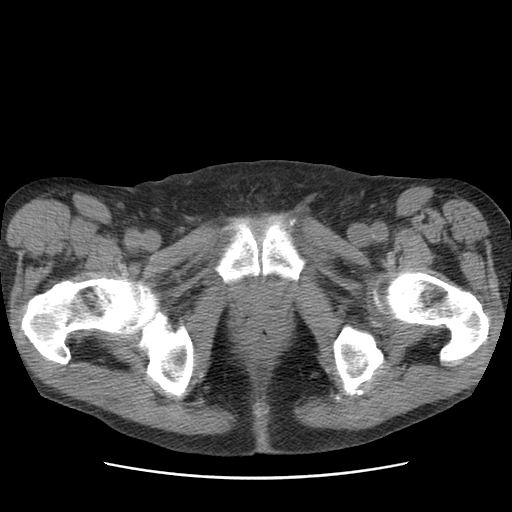
[im 21/99  soft-tissue]
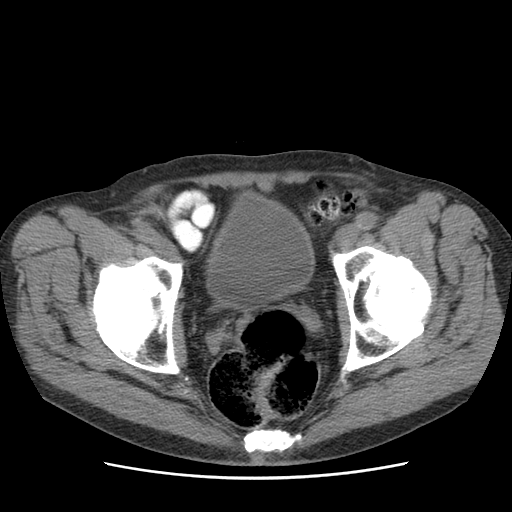
[im 25/99  soft-tissue]
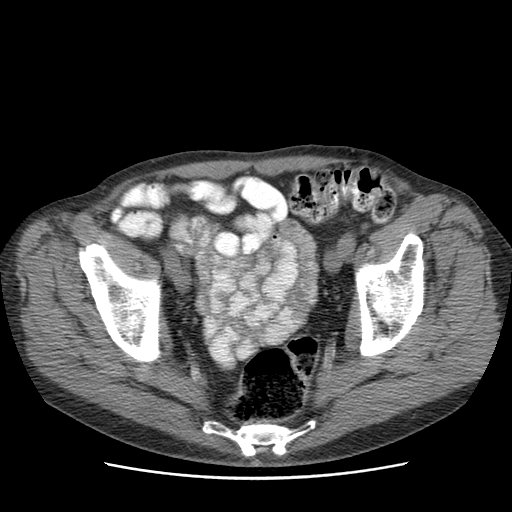
[im 33/99  soft-tissue]
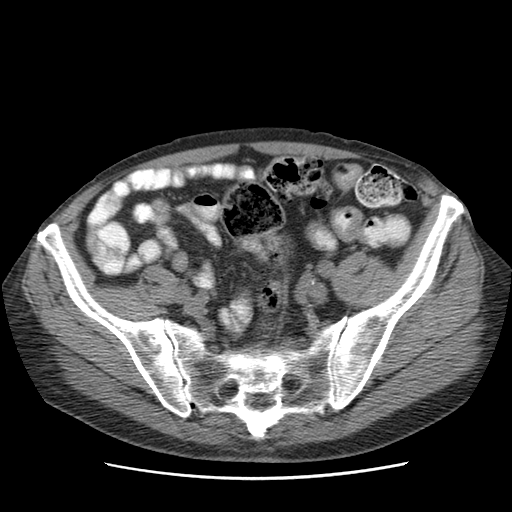
[im 41/99  soft-tissue]
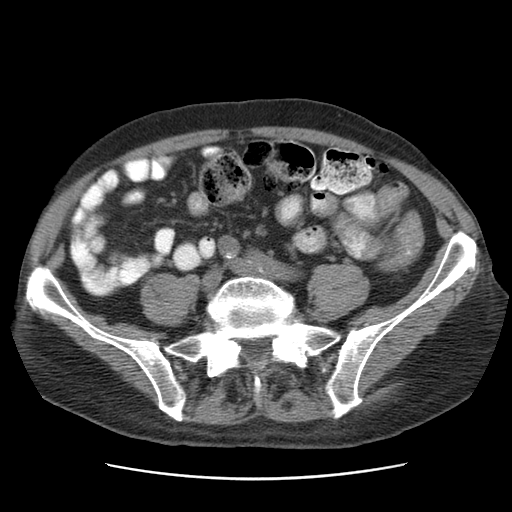
[im 45/99  soft-tissue]
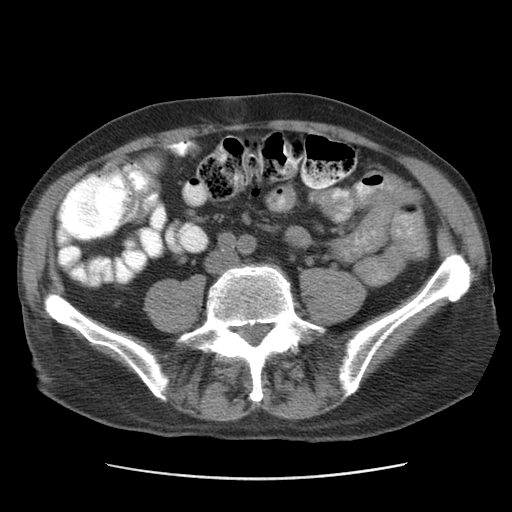
[im 54/99  soft-tissue]
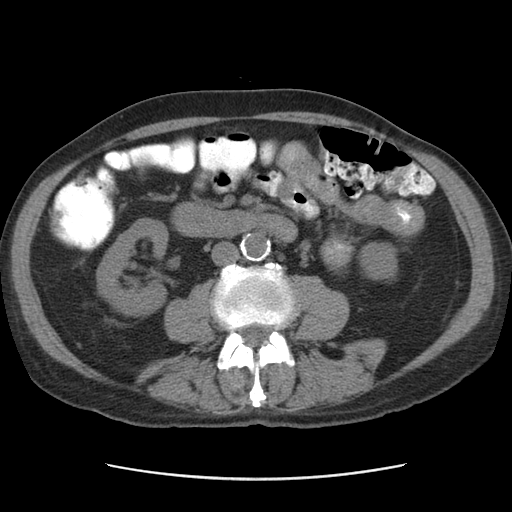
[im 58/99  soft-tissue]
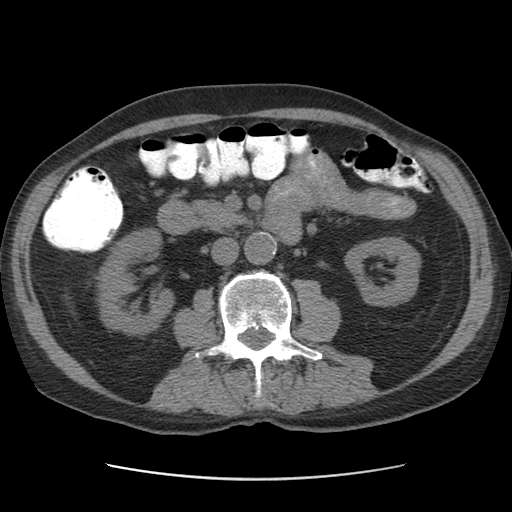
[im 58/99  bone]
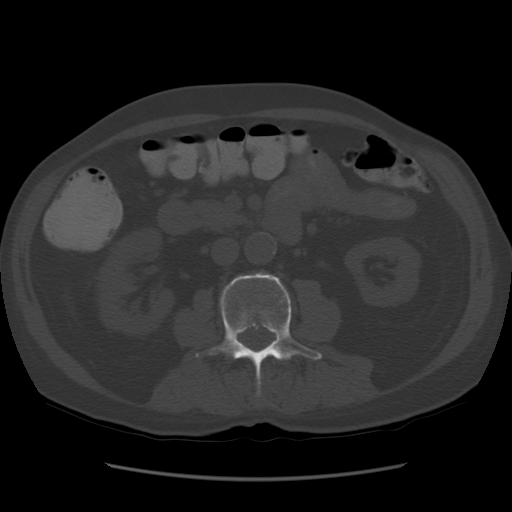
[im 66/99  soft-tissue]
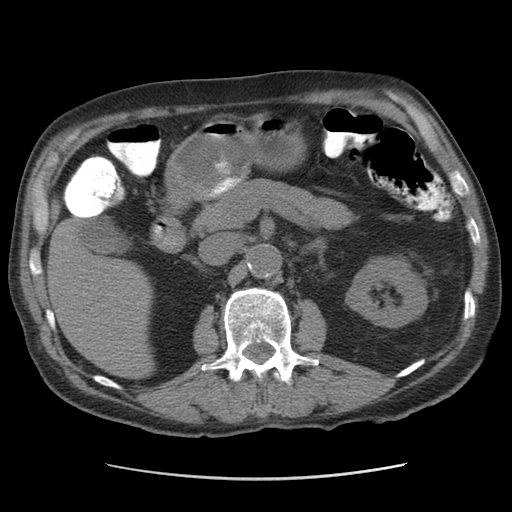
[im 74/99  soft-tissue]
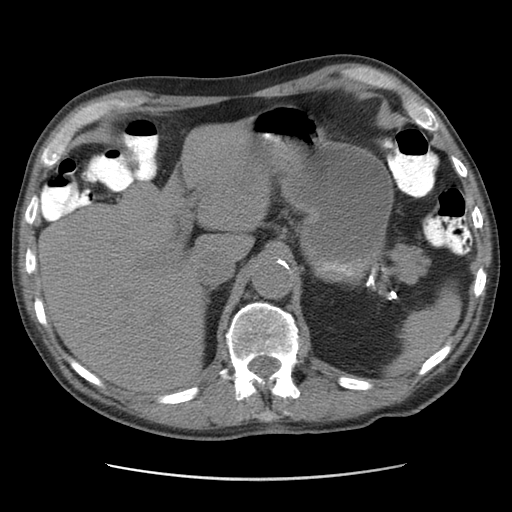
[im 78/99  soft-tissue]
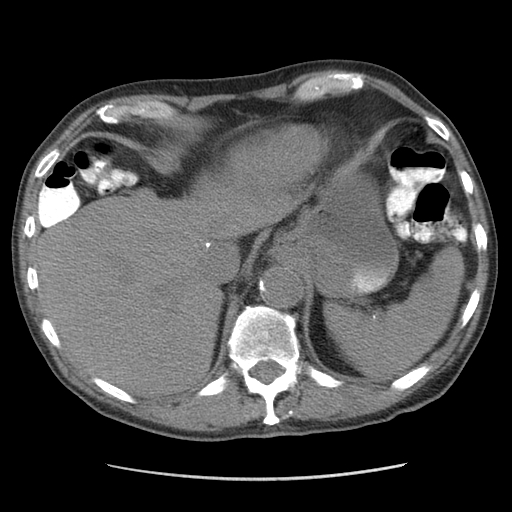
[im 86/99  soft-tissue]
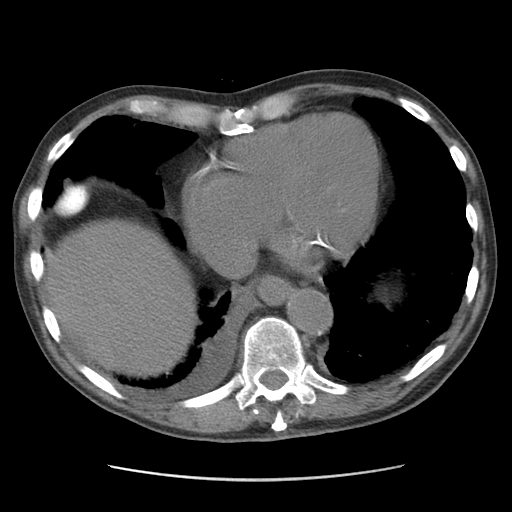
[im 94/99  soft-tissue]
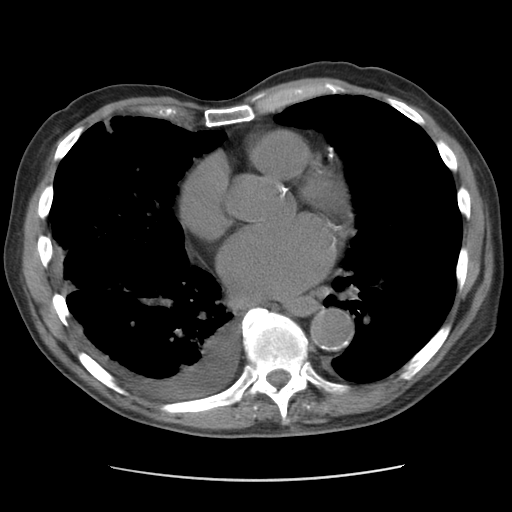

[Series 400: cor · coronal · 0.98mm/px · 3 of 125 slices shown]
[im 42/125  soft-tissue]
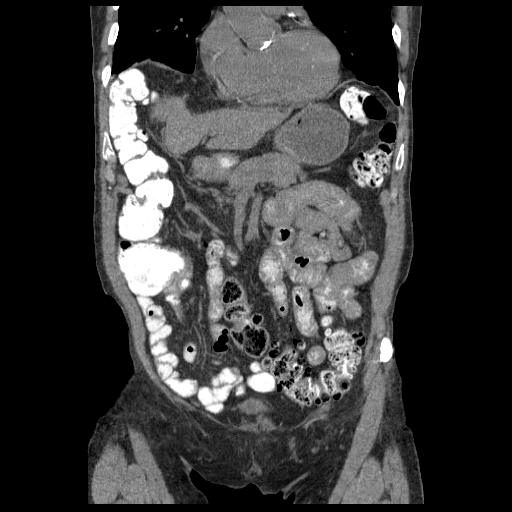
[im 56/125  soft-tissue]
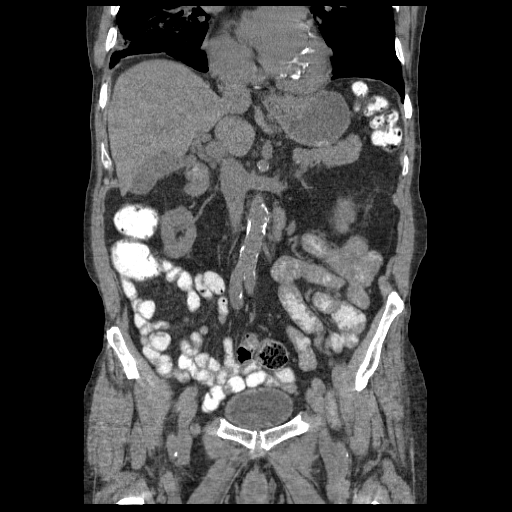
[im 69/125  soft-tissue]
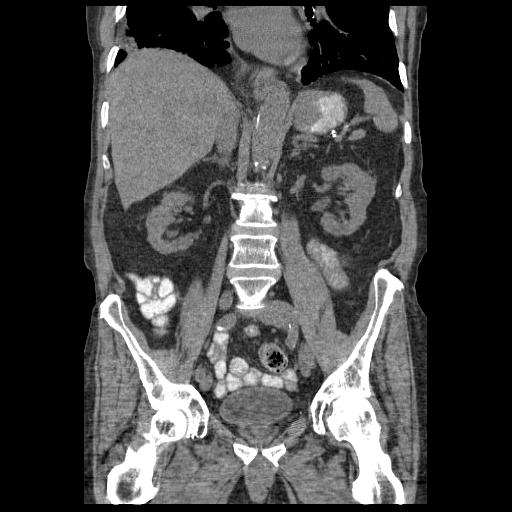

[17 of 46 positions shown; findings below may reference images not displayed]

FINDINGS: Small right pleural effusion. Subpleural reticulation/fibrosis at
the lung bases.

Small hiatal hernia.

Unenhanced liver, spleen, pancreas, and adrenal glands are within
normal limits.

Gallbladder notable for probable layering sludge (series these
densities 3). No intrahepatic or extrahepatic ductal dilatation.

Punctate nonobstructing left upper pole renal calculus (coronal
image 78). Right kidney is within normal limits. No hydronephrosis.

No evidence of bowel obstruction. Colonic diverticulosis, without
associated inflammatory changes.

Atherosclerotic calcifications of the abdominal aorta and branch
vessels.

No abdominopelvic ascites.

No suspicious abdominopelvic lymphadenopathy.

Prostate is unremarkable.

No ureteral or bladder calculi. Bladder is mildly thick-walled
although underdistended.

Small fat containing periumbilical hernia (series 2/ image 23).
Small fat containing right inguinal hernia (series 2/ image 86).

Degenerative changes of the visualized thoracolumbar spine.
IMPRESSION: Punctate nonobstructing left upper pole renal calculus. No ureteral
or bladder calculi. No hydronephrosis.

Bladder is mildly thick-walled although underdistended. Correlate
for cystitis.

Small right pleural effusion.

## 2014-04-20 IMAGING — CR DG CHEST 1V PORT
1 series · 1 of 1 positions shown · non-contrast
Comparison: Chest radiograph performed 05/08/2013

CLINICAL DATA: Shortness of breath.

EXAM:
PORTABLE CHEST - 1 VIEW

[AP]
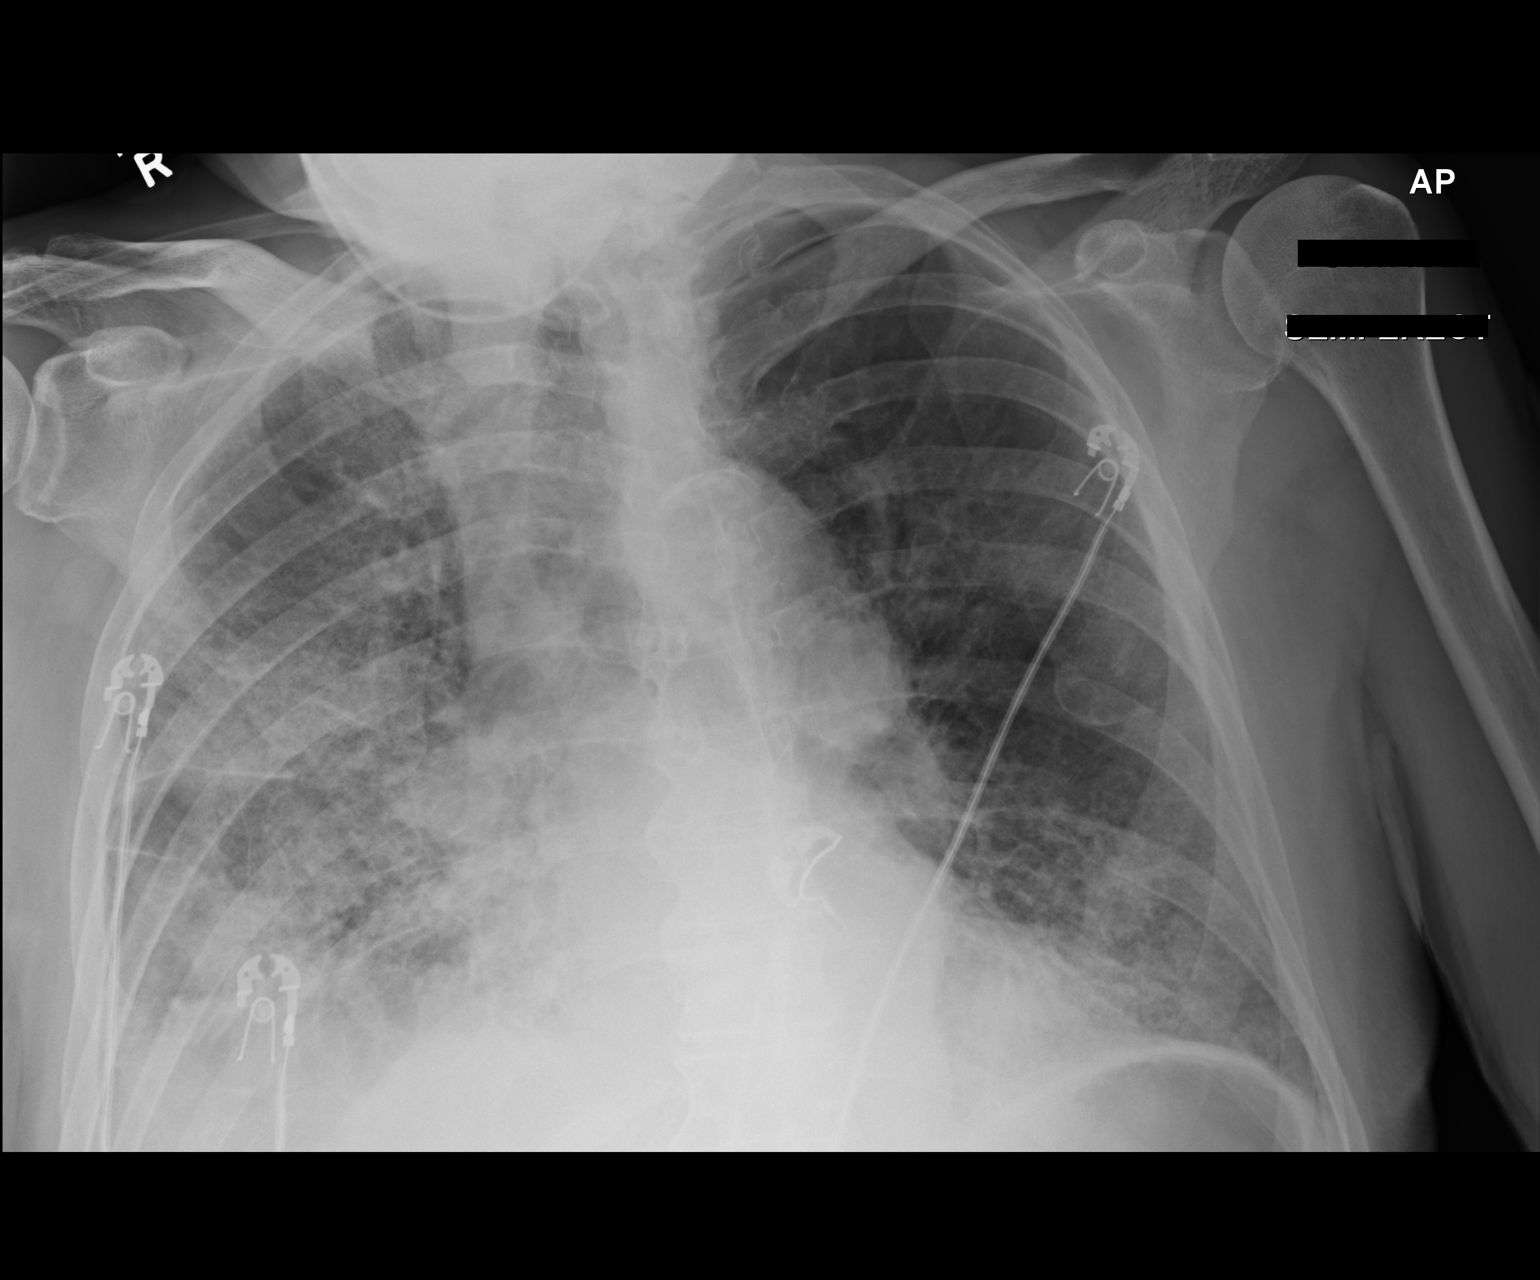

[1 of 1 positions shown; findings below may reference images not displayed]

FINDINGS: The lungs are relatively well expanded. There is relatively diffuse
right-sided airspace opacification, and more mild left basilar
airspace opacity. This may reflect asymmetric pulmonary edema or
multifocal pneumonia. A small right pleural effusion is seen. No
pneumothorax is identified.

The cardiomediastinal silhouette is borderline enlarged. No acute
osseous abnormalities are seen.
IMPRESSION: 1. Relatively diffuse right-sided airspace opacification, and more
mild left basilar airspace opacity. This may reflect asymmetric
pulmonary edema or multifocal pneumonia. Small right pleural
effusion seen.
2. Borderline cardiomegaly noted.

## 2014-04-22 IMAGING — CR DG CHEST 1V PORT
1 series · 1 of 1 positions shown · non-contrast
Comparison: 06/01/2013.

CLINICAL DATA: Hypoxia.

EXAM:
PORTABLE CHEST - 1 VIEW

[AP]
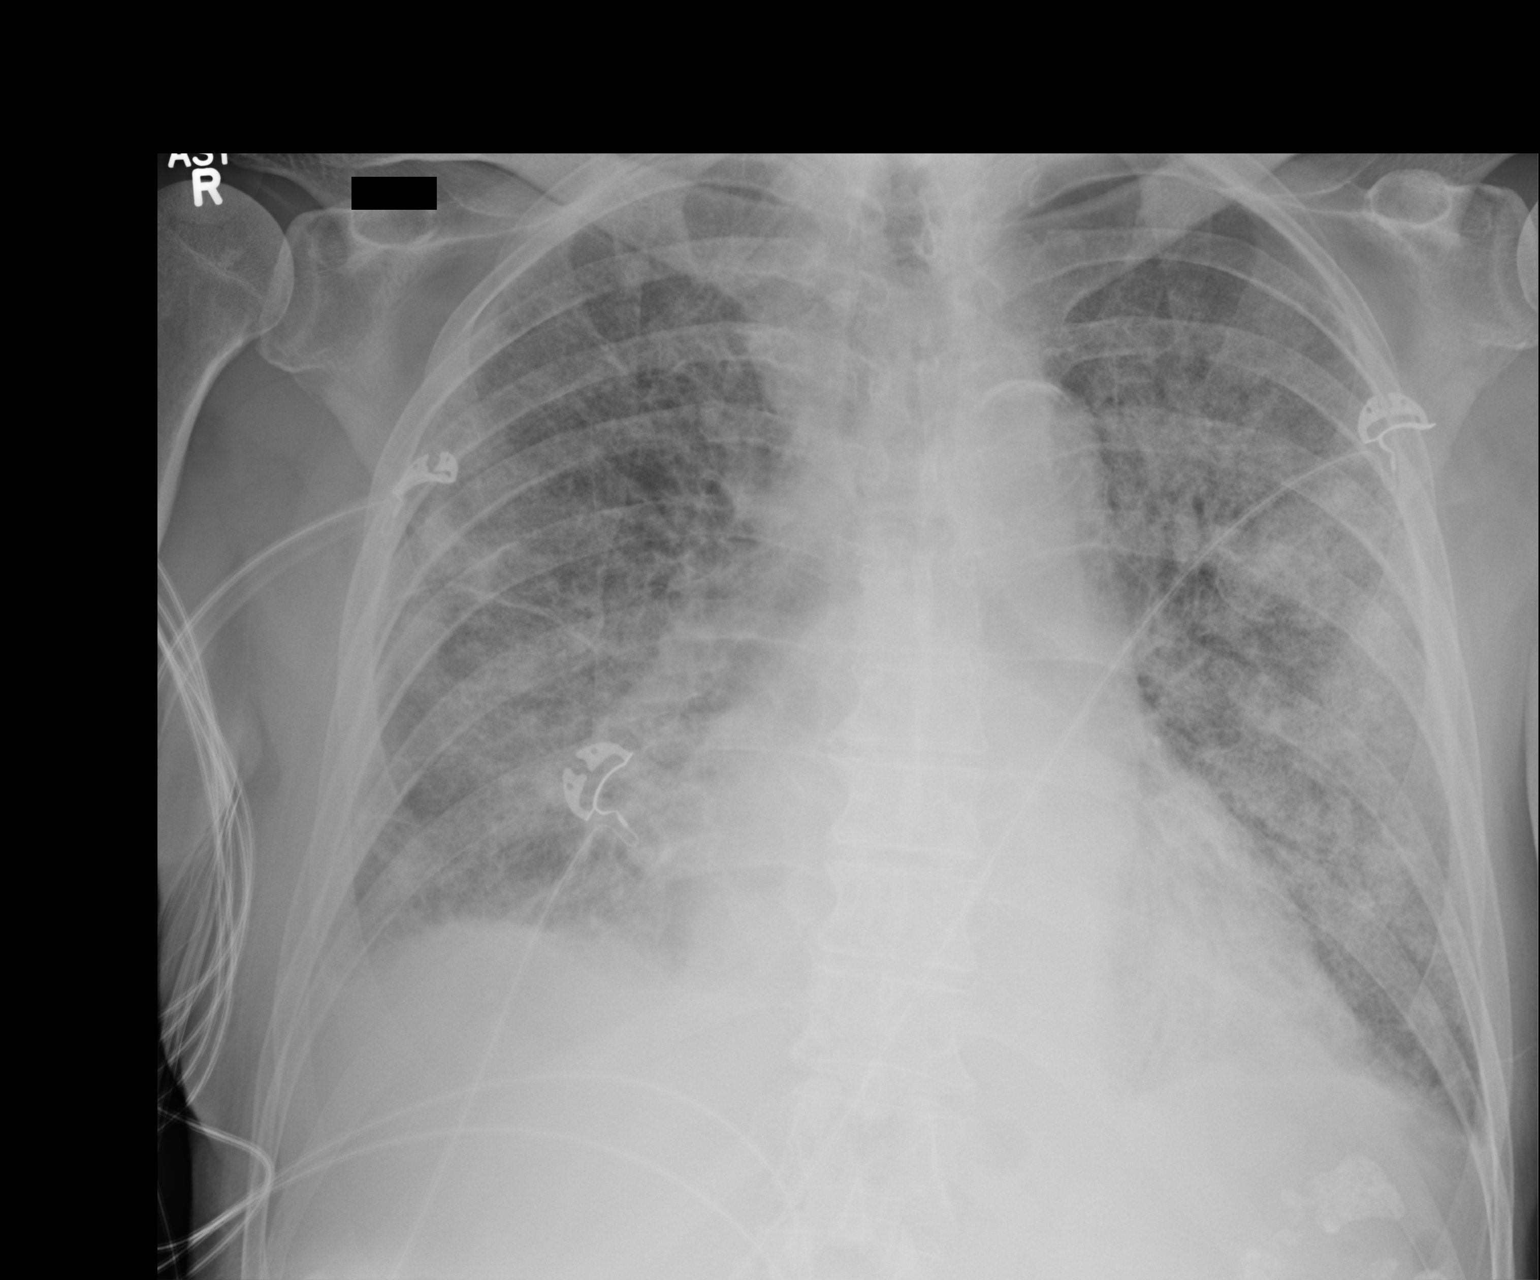

[1 of 1 positions shown; findings below may reference images not displayed]

FINDINGS: Progressive diffuse airspace disease with change most notable
involving the left upper lobe. This may represent asymmetric
pulmonary edema versus infectious infiltrate

No gross pneumothorax.

Heart size top-normal slightly enlarged.

Calcified aorta.
IMPRESSION: Diffuse airspace disease with worsening most notable involving the
left upper lobe. Question pulmonary edema versus infectious
infiltrate.
